# Patient Record
Sex: Male | Born: 1965
Health system: Southern US, Community
[De-identification: ages and names within clinical notes are randomized; demographics above are authoritative.]

## PROBLEM LIST (undated history)

## (undated) DIAGNOSIS — I10 Essential (primary) hypertension: Secondary | ICD-10-CM

## (undated) DIAGNOSIS — E785 Hyperlipidemia, unspecified: Secondary | ICD-10-CM

## (undated) DIAGNOSIS — E119 Type 2 diabetes mellitus without complications: Secondary | ICD-10-CM

## (undated) DIAGNOSIS — T7840XA Allergy, unspecified, initial encounter: Secondary | ICD-10-CM

## (undated) HISTORY — PX: COLONOSCOPY: SHX174

## (undated) HISTORY — PX: NO PAST SURGERIES: SHX2092

## (undated) HISTORY — DX: Allergy, unspecified, initial encounter: T78.40XA

## (undated) HISTORY — DX: Hyperlipidemia, unspecified: E78.5

## (undated) HISTORY — DX: Type 2 diabetes mellitus without complications: E11.9

## (undated) HISTORY — DX: Essential (primary) hypertension: I10

---

## 2010-01-07 ENCOUNTER — Observation Stay (HOSPITAL_COMMUNITY): Admission: EM | Admit: 2010-01-07 | Discharge: 2010-01-07 | Payer: Self-pay | Admitting: Emergency Medicine

## 2010-08-26 LAB — GLUCOSE, CAPILLARY
Glucose-Capillary: 239 mg/dL — ABNORMAL HIGH (ref 70–99)
Glucose-Capillary: 317 mg/dL — ABNORMAL HIGH (ref 70–99)

## 2010-08-26 LAB — POCT I-STAT, CHEM 8
BUN: 12 mg/dL (ref 6–23)
Calcium, Ion: 1.19 mmol/L (ref 1.12–1.32)
Chloride: 106 mEq/L (ref 96–112)
Creatinine, Ser: 1 mg/dL (ref 0.4–1.5)
Glucose, Bld: 251 mg/dL — ABNORMAL HIGH (ref 70–99)
HCT: 42 % (ref 39.0–52.0)
Hemoglobin: 14.3 g/dL (ref 13.0–17.0)
Potassium: 3.8 mEq/L (ref 3.5–5.1)
Sodium: 140 mEq/L (ref 135–145)
TCO2: 23 mmol/L (ref 0–100)

## 2011-06-04 ENCOUNTER — Ambulatory Visit (INDEPENDENT_AMBULATORY_CARE_PROVIDER_SITE_OTHER): Payer: BC Managed Care – PPO

## 2011-06-04 DIAGNOSIS — J111 Influenza due to unidentified influenza virus with other respiratory manifestations: Secondary | ICD-10-CM

## 2011-06-04 DIAGNOSIS — J40 Bronchitis, not specified as acute or chronic: Secondary | ICD-10-CM

## 2011-06-04 DIAGNOSIS — E789 Disorder of lipoprotein metabolism, unspecified: Secondary | ICD-10-CM

## 2011-08-02 ENCOUNTER — Other Ambulatory Visit: Payer: Self-pay | Admitting: Emergency Medicine

## 2011-08-02 ENCOUNTER — Other Ambulatory Visit: Payer: Self-pay | Admitting: Physician Assistant

## 2011-08-03 ENCOUNTER — Other Ambulatory Visit: Payer: Self-pay | Admitting: Emergency Medicine

## 2011-08-03 MED ORDER — METFORMIN HCL 1000 MG PO TABS: 1000.0000 mg | ORAL_TABLET | Freq: Two times a day (BID) | ORAL | Status: DC

## 2011-09-12 ENCOUNTER — Other Ambulatory Visit: Payer: Self-pay | Admitting: Physician Assistant

## 2011-09-14 ENCOUNTER — Other Ambulatory Visit: Payer: Self-pay | Admitting: *Deleted

## 2011-10-15 ENCOUNTER — Other Ambulatory Visit: Payer: Self-pay | Admitting: Physician Assistant

## 2011-10-17 ENCOUNTER — Ambulatory Visit (INDEPENDENT_AMBULATORY_CARE_PROVIDER_SITE_OTHER): Payer: BC Managed Care – PPO | Admitting: Internal Medicine

## 2011-10-17 VITALS — BP 149/88 | HR 75 | Temp 98.0°F | Resp 16 | Ht 70.5 in | Wt 210.0 lb

## 2011-10-17 DIAGNOSIS — R3589 Other polyuria: Secondary | ICD-10-CM

## 2011-10-17 DIAGNOSIS — E785 Hyperlipidemia, unspecified: Secondary | ICD-10-CM

## 2011-10-17 DIAGNOSIS — R358 Other polyuria: Secondary | ICD-10-CM

## 2011-10-17 DIAGNOSIS — E119 Type 2 diabetes mellitus without complications: Secondary | ICD-10-CM

## 2011-10-17 DIAGNOSIS — I10 Essential (primary) hypertension: Secondary | ICD-10-CM

## 2011-10-17 LAB — POCT URINALYSIS DIPSTICK
Bilirubin, UA: NEGATIVE
Glucose, UA: NEGATIVE
Ketones, UA: NEGATIVE
Leukocytes, UA: NEGATIVE
Nitrite, UA: NEGATIVE
Protein, UA: NEGATIVE
Spec Grav, UA: 1.01
Urobilinogen, UA: 0.2
pH, UA: 6.5

## 2011-10-17 LAB — POCT GLYCOSYLATED HEMOGLOBIN (HGB A1C): Hemoglobin A1C: 7.4

## 2011-10-17 LAB — POCT UA - MICROSCOPIC ONLY
Bacteria, U Microscopic: NEGATIVE
Casts, Ur, LPF, POC: NEGATIVE
Crystals, Ur, HPF, POC: NEGATIVE
Mucus, UA: NEGATIVE
RBC, urine, microscopic: NEGATIVE
WBC, Ur, HPF, POC: NEGATIVE
Yeast, UA: NEGATIVE

## 2011-10-17 LAB — GLUCOSE, POCT (MANUAL RESULT ENTRY): POC Glucose: 129

## 2011-10-17 MED ORDER — GLUCOSE BLOOD VI STRP: ORAL_STRIP | Status: DC

## 2011-10-17 MED ORDER — GLIPIZIDE ER 10 MG PO TB24: 10.0000 mg | ORAL_TABLET | Freq: Every day | ORAL | Status: DC

## 2011-10-17 MED ORDER — FREESTYLE LANCETS MISC: Status: DC

## 2011-10-17 MED ORDER — FREESTYLE SYSTEM KIT: 1.0000 | PACK | Status: DC | PRN

## 2011-10-17 MED ORDER — PRAVASTATIN SODIUM 20 MG PO TABS: 20.0000 mg | ORAL_TABLET | Freq: Every day | ORAL | Status: DC

## 2011-10-17 MED ORDER — LOSARTAN POTASSIUM 25 MG PO TABS: 25.0000 mg | ORAL_TABLET | Freq: Every day | ORAL | Status: DC

## 2011-10-17 NOTE — Patient Instructions (Signed)
Test your sugar in the am before eating and at dinner timed before eating and bring the record to your next visit in one week to re evaluate you diabetes medications.

## 2011-10-17 NOTE — Progress Notes (Signed)
Subjective:    Patient ID: Jackson Mahoney, male    DOB: 1965/11/24, 46 y.o.   MRN: [id number]  HPI Weakness thirsty urinates 2 times a night but thinks his weakness is because his sugar is low so he is not taking his diabetes medication. Also takes his bp med most of the time. No weight loss, no numbness of extremities. No visual deficits, no problems with urination. He feels better when he eats more because he feels like his sugar is running low. He lost his glucometer but sometimes gets his sugar tested at work and today after eating a sugar donut it was 96.   Review of Systems  Unable to perform ROS Constitutional: Positive for appetite change and fatigue.  HENT: Negative.   Eyes: Negative.   Respiratory: Negative.   Cardiovascular: Negative.   Gastrointestinal: Negative.   Genitourinary: Negative.   Musculoskeletal: Negative.   Skin: Negative.   Neurological: Negative.   Hematological: Negative.   Psychiatric/Behavioral: Negative.   All other systems reviewed and are negative.       Objective:   Physical Exam  Nursing note and vitals reviewed. Constitutional: He is oriented to person, place, and time. He appears well-developed and well-nourished.  HENT:  Head: Normocephalic and atraumatic.  Right Ear: External ear normal.  Left Ear: External ear normal.  Eyes: Conjunctivae and EOM are normal. Pupils are equal, round, and reactive to light.  Neck: Normal range of motion. Neck supple.  Cardiovascular: Normal rate and regular rhythm.   Pulmonary/Chest: Effort normal and breath sounds normal.  Abdominal: Soft. Bowel sounds are normal.  Musculoskeletal: Normal range of motion.  Neurological: He is alert and oriented to person, place, and time. He has normal reflexes.  Skin: Skin is warm and dry.  Psychiatric: He has a normal mood and affect. His behavior is normal. Judgment and thought content normal.    Results for orders placed in visit on 10/17/11  POCT UA - MICROSCOPIC  ONLY      Component Value Range   WBC, Ur, HPF, POC negative     RBC, urine, microscopic negative     Bacteria, U Microscopic negative     Mucus, UA negative     Epithelial cells, urine per micros 0-1     Crystals, Ur, HPF, POC negative     Casts, Ur, LPF, POC negative     Yeast, UA negative    POCT URINALYSIS DIPSTICK      Component Value Range   Color, UA yellow     Clarity, UA clear     Glucose, UA negative     Bilirubin, UA negative     Ketones, UA negative     Spec Grav, UA 1.010     Blood, UA trace-intact     pH, UA 6.5     Protein, UA negative     Urobilinogen, UA 0.2     Nitrite, UA negative     Leukocytes, UA Negative    GLUCOSE, POCT (MANUAL RESULT ENTRY)      Component Value Range   POC Glucose 129    POCT GLYCOSYLATED HEMOGLOBIN (HGB A1C)      Component Value Range   Hemoglobin A1C 7.4          Assessment & Plan:  Noncompliant with medications for diabetes and hypertension. I suspect his hunger is secondary to his uncontrolled diabetes. Will check hgb a1c and lipids. Microalbuminuria to be checked., Bp is poorly controlled bu pt is not taking meds  daily. Spent 20 minutes in discussion with the patient about the need for him to comply with medication.will continue to encourage pt to take his medication as prescribed.

## 2011-10-18 LAB — COMPREHENSIVE METABOLIC PANEL
ALT: 9 U/L (ref 0–53)
AST: 19 U/L (ref 0–37)
Albumin: 4.2 g/dL (ref 3.5–5.2)
Alkaline Phosphatase: 52 U/L (ref 39–117)
BUN: 15 mg/dL (ref 6–23)
CO2: 28 mEq/L (ref 19–32)
Calcium: 10.3 mg/dL (ref 8.4–10.5)
Chloride: 104 mEq/L (ref 96–112)
Creat: 0.94 mg/dL (ref 0.50–1.35)
Glucose, Bld: 130 mg/dL — ABNORMAL HIGH (ref 70–99)
Potassium: 4.5 mEq/L (ref 3.5–5.3)
Sodium: 136 mEq/L (ref 135–145)
Total Bilirubin: 0.3 mg/dL (ref 0.3–1.2)
Total Protein: 7.2 g/dL (ref 6.0–8.3)

## 2011-10-18 LAB — LIPID PANEL
Cholesterol: 196 mg/dL (ref 0–200)
HDL: 52 mg/dL (ref >39)
LDL Cholesterol: 129 mg/dL — ABNORMAL HIGH (ref 0–99)
Total CHOL/HDL Ratio: 3.8 Ratio
Triglycerides: 75 mg/dL (ref <150)
VLDL: 15 mg/dL (ref 0–40)

## 2011-10-18 LAB — PSA: PSA: 0.27 ng/mL (ref <=4.00)

## 2011-10-19 LAB — MICROALBUMIN, URINE: Microalb, Ur: 3.11 mg/dL — ABNORMAL HIGH (ref 0.00–1.89)

## 2011-10-28 ENCOUNTER — Other Ambulatory Visit: Payer: Self-pay | Admitting: Physician Assistant

## 2012-05-09 ENCOUNTER — Other Ambulatory Visit: Payer: Self-pay | Admitting: Physician Assistant

## 2012-07-07 ENCOUNTER — Telehealth: Payer: Self-pay

## 2012-07-07 DIAGNOSIS — I10 Essential (primary) hypertension: Secondary | ICD-10-CM

## 2012-07-07 DIAGNOSIS — E785 Hyperlipidemia, unspecified: Secondary | ICD-10-CM

## 2012-07-07 MED ORDER — LOSARTAN POTASSIUM 25 MG PO TABS: 25.0000 mg | ORAL_TABLET | Freq: Every day | ORAL | Status: DC

## 2012-07-07 MED ORDER — METFORMIN HCL 1000 MG PO TABS: 1000.0000 mg | ORAL_TABLET | Freq: Two times a day (BID) | ORAL | Status: DC

## 2012-07-07 MED ORDER — PRAVASTATIN SODIUM 20 MG PO TABS: 20.0000 mg | ORAL_TABLET | Freq: Every day | ORAL | Status: DC

## 2012-07-07 MED ORDER — GLIPIZIDE ER 10 MG PO TB24: 10.0000 mg | ORAL_TABLET | Freq: Every day | ORAL | Status: DC

## 2012-07-07 NOTE — Telephone Encounter (Signed)
I have sent in #15 of all medications, but he MUST have an office visit for any additional refills

## 2012-07-07 NOTE — Telephone Encounter (Signed)
PT SAID HE HAS MIXED UP HIS MEDICATIONS AND NEEDS A LIST OF WHAT HE IS SUPPOSED TO BE TAKING.  PLEASE CALL HIM AT [phone]

## 2012-07-07 NOTE — Telephone Encounter (Signed)
Called patient to help him. He states he is confused about what he should be taking because he has ran out. He can come in on Monday next week, ? Okay to send in a supply for him to last until he can get in, pended 15 day supply. Please advise

## 2012-07-07 NOTE — Telephone Encounter (Signed)
Patient advised, and agrees.

## 2012-07-23 ENCOUNTER — Telehealth: Payer: Self-pay

## 2012-07-23 NOTE — Telephone Encounter (Signed)
ERROR

## 2012-07-25 ENCOUNTER — Ambulatory Visit (INDEPENDENT_AMBULATORY_CARE_PROVIDER_SITE_OTHER): Payer: BC Managed Care – PPO | Admitting: Family Medicine

## 2012-07-25 VITALS — BP 158/84 | HR 76 | Temp 98.0°F | Resp 16 | Ht 70.75 in | Wt 213.6 lb

## 2012-07-25 DIAGNOSIS — E785 Hyperlipidemia, unspecified: Secondary | ICD-10-CM

## 2012-07-25 DIAGNOSIS — IMO0001 Reserved for inherently not codable concepts without codable children: Secondary | ICD-10-CM

## 2012-07-25 DIAGNOSIS — E119 Type 2 diabetes mellitus without complications: Secondary | ICD-10-CM | POA: Insufficient documentation

## 2012-07-25 DIAGNOSIS — I10 Essential (primary) hypertension: Secondary | ICD-10-CM

## 2012-07-25 DIAGNOSIS — E782 Mixed hyperlipidemia: Secondary | ICD-10-CM | POA: Insufficient documentation

## 2012-07-25 LAB — COMPREHENSIVE METABOLIC PANEL
ALT: 15 U/L (ref 0–53)
AST: 20 U/L (ref 0–37)
Albumin: 4.4 g/dL (ref 3.5–5.2)
Alkaline Phosphatase: 58 U/L (ref 39–117)
BUN: 13 mg/dL (ref 6–23)
CO2: 28 mEq/L (ref 19–32)
Calcium: 9.7 mg/dL (ref 8.4–10.5)
Chloride: 99 mEq/L (ref 96–112)
Creat: 1.06 mg/dL (ref 0.50–1.35)
Glucose, Bld: 279 mg/dL — ABNORMAL HIGH (ref 70–99)
Potassium: 4.4 mEq/L (ref 3.5–5.3)
Sodium: 133 mEq/L — ABNORMAL LOW (ref 135–145)
Total Bilirubin: 0.5 mg/dL (ref 0.3–1.2)
Total Protein: 7.6 g/dL (ref 6.0–8.3)

## 2012-07-25 LAB — GLUCOSE, POCT (MANUAL RESULT ENTRY): POC Glucose: 279 mg/dl — AB (ref 70–99)

## 2012-07-25 LAB — LIPID PANEL
Cholesterol: 184 mg/dL (ref 0–200)
HDL: 49 mg/dL (ref >39)
LDL Cholesterol: 121 mg/dL — ABNORMAL HIGH (ref 0–99)
Total CHOL/HDL Ratio: 3.8 Ratio
Triglycerides: 72 mg/dL (ref <150)
VLDL: 14 mg/dL (ref 0–40)

## 2012-07-25 LAB — POCT GLYCOSYLATED HEMOGLOBIN (HGB A1C): Hemoglobin A1C: 10.6

## 2012-07-25 MED ORDER — PRAVASTATIN SODIUM 20 MG PO TABS: 20.0000 mg | ORAL_TABLET | Freq: Every day | ORAL | Status: DC

## 2012-07-25 MED ORDER — GLIPIZIDE ER 10 MG PO TB24: 10.0000 mg | ORAL_TABLET | Freq: Every day | ORAL | Status: DC

## 2012-07-25 MED ORDER — LOSARTAN POTASSIUM 25 MG PO TABS: 25.0000 mg | ORAL_TABLET | Freq: Every day | ORAL | Status: DC

## 2012-07-25 MED ORDER — GLUCOSE BLOOD VI STRP: ORAL_STRIP | Status: AC

## 2012-07-25 MED ORDER — METFORMIN HCL 1000 MG PO TABS: 1000.0000 mg | ORAL_TABLET | Freq: Two times a day (BID) | ORAL | Status: DC

## 2012-07-25 MED ORDER — FREESTYLE LANCETS MISC: Status: AC

## 2012-07-25 MED ORDER — FREESTYLE SYSTEM KIT: 1.0000 | PACK | Status: AC | PRN

## 2012-07-25 NOTE — Progress Notes (Signed)
Subjective: 47 year old gentleman who is here to give his medicines refilled. He really does not want to get a complete physical examination in the note is listed as such. he ran out of his medicines a week ago. He does not really watch his diet, though he has lost a few pounds after getting up to a maximum of 220. He is not getting regular exercise. He has no major acute complaints. Cardiovascular unremarkable. Respiratory unremarkable. GI unremarkable except for some constipation. GU unremarkable. Nocturia x2.  Objective: Overweight male in no major distress. His eyes PERRLA fundi benign. He says that he got his last eye check a year ago or so. Throat clear. Neck supple without nodes thyromegaly. Chest clear to auscultation. Heart regular without murmurs. And soft without mass or tenderness. No ankle edema. Filament test negative.  Assessment: Hypertension Hyperlipidemia Type 2 diabetes mellitus Obesity Constipation  Plan: Check labs  Results for orders placed in visit on 07/25/12  GLUCOSE, POCT (MANUAL RESULT ENTRY)      Result Value Range   POC Glucose 279 (*) 70 - 99 mg/dl  POCT GLYCOSYLATED HEMOGLOBIN (HGB A1C)      Result Value Range   Hemoglobin A1C 10.6

## 2012-07-25 NOTE — Patient Instructions (Signed)
Take the medications faithfully  Get regular exercise  Watch your diet. You should try hard to lose about 15 pounds before your next visit.  Return in late May or early June for a complete physical examination. Try to come in in the morning before you have had anything to eat. If you ask for Dr. Alwyn Ren I will be happy to see you. Call ahead of time to make sure that I will be having an early shift at that time.

## 2012-07-26 LAB — MICROALBUMIN, URINE: Microalb, Ur: 22.62 mg/dL — ABNORMAL HIGH (ref 0.00–1.89)

## 2012-07-31 ENCOUNTER — Encounter: Payer: Self-pay | Admitting: Family Medicine

## 2012-08-29 ENCOUNTER — Other Ambulatory Visit: Payer: Self-pay | Admitting: Family Medicine

## 2012-08-30 ENCOUNTER — Ambulatory Visit: Payer: BC Managed Care – PPO

## 2012-08-30 ENCOUNTER — Ambulatory Visit (INDEPENDENT_AMBULATORY_CARE_PROVIDER_SITE_OTHER): Payer: BC Managed Care – PPO | Admitting: Family Medicine

## 2012-08-30 VITALS — BP 161/95 | HR 82 | Temp 98.0°F | Resp 16 | Ht 70.5 in | Wt 216.4 lb

## 2012-08-30 DIAGNOSIS — R109 Unspecified abdominal pain: Secondary | ICD-10-CM

## 2012-08-30 DIAGNOSIS — K5900 Constipation, unspecified: Secondary | ICD-10-CM

## 2012-08-30 DIAGNOSIS — E119 Type 2 diabetes mellitus without complications: Secondary | ICD-10-CM

## 2012-08-30 LAB — POCT CBC
Granulocyte percent: 55.6 %G (ref 37–80)
HCT, POC: 38.3 % — AB (ref 43.5–53.7)
Hemoglobin: 11.9 g/dL — AB (ref 14.1–18.1)
Lymph, poc: 1.9 (ref 0.6–3.4)
MCH, POC: 26.9 pg — AB (ref 27–31.2)
MCHC: 31.1 g/dL — AB (ref 31.8–35.4)
MCV: 86.7 fL (ref 80–97)
MID (cbc): 0.3 (ref 0–0.9)
MPV: 9.9 fL (ref 0–99.8)
POC Granulocyte: 2.8 (ref 2–6.9)
POC LYMPH PERCENT: 38.2 %L (ref 10–50)
POC MID %: 6.2 %M (ref 0–12)
Platelet Count, POC: 234 10*3/uL (ref 142–424)
RBC: 4.42 M/uL — AB (ref 4.69–6.13)
RDW, POC: 14.7 %
WBC: 5 10*3/uL (ref 4.6–10.2)

## 2012-08-30 LAB — GLUCOSE, POCT (MANUAL RESULT ENTRY): POC Glucose: 148 mg/dl — AB (ref 70–99)

## 2012-08-30 NOTE — Patient Instructions (Signed)
Drink lots of fluids  Continue to walk regularly.  Eat plenty of fruits, avoiding excessive bananas and plantains  Take MiraLax twice daily for a couple of days, then once daily.  If Your bowel start getting loose, cut back to one half dose daily, then used only 2-3 times a week as needed.  Watch your bowels are moving regularly you may wish to consider taking Metamucil daily.  If the MiraLax has not helped the constipation, you can take a couple of Dulcolax tablets in addition to that. Use this only on occasion when her when necessary.

## 2012-08-30 NOTE — Progress Notes (Signed)
Subjective: 47 year old man from [place] who was here last month. He says his diabetes is doing much better. For the last 2 weeks she's been having intermittent abdominal pain, more in the mornings. His abdomen does feel a little bloated. He has some problems with an urge to move his bowels and then them not moving at times. He's not been having diarrhea. He has no nausea or vomiting. He does have a lot of gas. No fevers. No blood in his stool or urine. No dysuria.  Objective: Pleasant alert gentleman in no acute distress. Abdomen has normal bowel sounds. Mildly tympanitic. Soft, without organomegaly or masses but is full. No CVA tenderness.  Assessment: Abdominal pain 4 constipation Diabetes mellitus  Plan: CBC glucose and x-ray of abdomen.  Results for orders placed in visit on 08/30/12  POCT CBC      Result Value Range   WBC 5.0  4.6 - 10.2 K/uL   Lymph, poc 1.9  0.6 - 3.4   POC LYMPH PERCENT 38.2  10 - 50 %L   MID (cbc) 0.3  0 - 0.9   POC MID % 6.2  0 - 12 %M   POC Granulocyte 2.8  2 - 6.9   Granulocyte percent 55.6  37 - 80 %G   RBC 4.42 (*) 4.69 - 6.13 M/uL   Hemoglobin 11.9 (*) 14.1 - 18.1 g/dL   HCT, POC 78.2 (*) 95.6 - 53.7 %   MCV 86.7  80 - 97 fL   MCH, POC 26.9 (*) 27 - 31.2 pg   MCHC 31.1 (*) 31.8 - 35.4 g/dL   RDW, POC 21.3     Platelet Count, POC 234  142 - 424 K/uL   MPV 9.9  0 - 99.8 fL  GLUCOSE, POCT (MANUAL RESULT ENTRY)      Result Value Range   POC Glucose 148 (*) 70 - 99 mg/dl   [facility] reading (PRIMARY) by  Dr. Alwyn Ren Normal except lots of stool.  Constipation Abdominal pain

## 2012-10-16 ENCOUNTER — Ambulatory Visit (INDEPENDENT_AMBULATORY_CARE_PROVIDER_SITE_OTHER): Payer: BC Managed Care – PPO | Admitting: Family Medicine

## 2012-10-16 VITALS — BP 163/93 | HR 87 | Temp 98.0°F | Resp 16 | Ht 71.0 in | Wt 216.0 lb

## 2012-10-16 DIAGNOSIS — R112 Nausea with vomiting, unspecified: Secondary | ICD-10-CM

## 2012-10-16 DIAGNOSIS — H8110 Benign paroxysmal vertigo, unspecified ear: Secondary | ICD-10-CM

## 2012-10-16 DIAGNOSIS — I10 Essential (primary) hypertension: Secondary | ICD-10-CM

## 2012-10-16 DIAGNOSIS — E119 Type 2 diabetes mellitus without complications: Secondary | ICD-10-CM

## 2012-10-16 DIAGNOSIS — E785 Hyperlipidemia, unspecified: Secondary | ICD-10-CM

## 2012-10-16 DIAGNOSIS — R42 Dizziness and giddiness: Secondary | ICD-10-CM

## 2012-10-16 LAB — CBC WITH DIFFERENTIAL/PLATELET
Basophils Absolute: 0 10*3/uL (ref 0.0–0.1)
Basophils Relative: 1 % (ref 0–1)
Eosinophils Absolute: 0.1 10*3/uL (ref 0.0–0.7)
Eosinophils Relative: 2 % (ref 0–5)
HCT: 37.9 % — ABNORMAL LOW (ref 39.0–52.0)
Hemoglobin: 12.6 g/dL — ABNORMAL LOW (ref 13.0–17.0)
Lymphocytes Relative: 27 % (ref 12–46)
Lymphs Abs: 1.7 10*3/uL (ref 0.7–4.0)
MCH: 28.1 pg (ref 26.0–34.0)
MCHC: 33.2 g/dL (ref 30.0–36.0)
MCV: 84.4 fL (ref 78.0–100.0)
Monocytes Absolute: 0.4 10*3/uL (ref 0.1–1.0)
Monocytes Relative: 6 % (ref 3–12)
Neutro Abs: 4 10*3/uL (ref 1.7–7.7)
Neutrophils Relative %: 64 % (ref 43–77)
Platelets: 200 10*3/uL (ref 150–400)
RBC: 4.49 MIL/uL (ref 4.22–5.81)
RDW: 14.8 % (ref 11.5–15.5)
WBC: 6.2 10*3/uL (ref 4.0–10.5)

## 2012-10-16 LAB — COMPREHENSIVE METABOLIC PANEL
ALT: 9 U/L (ref 0–53)
AST: 14 U/L (ref 0–37)
Albumin: 4.2 g/dL (ref 3.5–5.2)
Alkaline Phosphatase: 40 U/L (ref 39–117)
BUN: 12 mg/dL (ref 6–23)
CO2: 27 mEq/L (ref 19–32)
Calcium: 9.9 mg/dL (ref 8.4–10.5)
Chloride: 103 mEq/L (ref 96–112)
Creat: 1.01 mg/dL (ref 0.50–1.35)
Glucose, Bld: 126 mg/dL — ABNORMAL HIGH (ref 70–99)
Potassium: 5.1 mEq/L (ref 3.5–5.3)
Sodium: 135 mEq/L (ref 135–145)
Total Bilirubin: 0.3 mg/dL (ref 0.3–1.2)
Total Protein: 7.2 g/dL (ref 6.0–8.3)

## 2012-10-16 LAB — GLUCOSE, POCT (MANUAL RESULT ENTRY): POC Glucose: 144 mg/dl — AB (ref 70–99)

## 2012-10-16 LAB — POCT GLYCOSYLATED HEMOGLOBIN (HGB A1C): Hemoglobin A1C: 8.1

## 2012-10-16 MED ORDER — MECLIZINE HCL 25 MG PO TABS: 25.0000 mg | ORAL_TABLET | Freq: Three times a day (TID) | ORAL | Status: DC | PRN

## 2012-10-16 MED ORDER — PROMETHAZINE HCL 25 MG/ML IJ SOLN: 25.0000 mg | Freq: Four times a day (QID) | INTRAMUSCULAR | Status: DC | PRN

## 2012-10-16 MED ORDER — PROMETHAZINE HCL 25 MG PO TABS: 25.0000 mg | ORAL_TABLET | Freq: Three times a day (TID) | ORAL | Status: DC | PRN

## 2012-10-16 MED ORDER — PROMETHAZINE HCL 25 MG/ML IJ SOLN
25.0000 mg | Freq: Once | INTRAMUSCULAR | Status: AC
  Administered 2012-10-16: 25 mg via INTRAMUSCULAR

## 2012-10-16 MED ORDER — LOSARTAN POTASSIUM-HCTZ 50-12.5 MG PO TABS: 1.0000 | ORAL_TABLET | Freq: Every day | ORAL | Status: DC

## 2012-10-16 MED ORDER — LOSARTAN POTASSIUM-HCTZ 100-25 MG PO TABS: 1.0000 | ORAL_TABLET | Freq: Every day | ORAL | Status: DC

## 2012-10-16 NOTE — Progress Notes (Signed)
  Subjective:    Patient ID: Jackson Mahoney, male    DOB: 05-15-1966, 47 y.o.   MRN: [id number]  HPI    Review of Systems     Objective:   Physical Exam        Results for orders placed in visit on 10/16/12  GLUCOSE, POCT (MANUAL RESULT ENTRY)      Result Value Range   POC Glucose 144 (*) 70 - 99 mg/dl  POCT GLYCOSYLATED HEMOGLOBIN (HGB A1C)      Result Value Range   Hemoglobin A1C 8.1      Assessment & Plan:

## 2012-10-16 NOTE — Patient Instructions (Addendum)
Benign Positional Vertigo  Vertigo means you feel like you or your surroundings are moving when they are not. Benign positional vertigo is the most common form of vertigo. Benign means that the cause of your condition is not serious. Benign positional vertigo is more common in older adults.  CAUSES   Benign positional vertigo is the result of an upset in the labyrinth system. This is an area in the middle ear that helps control your balance. This may be caused by a viral infection, head injury, or repetitive motion. However, often no specific cause is found.  SYMPTOMS   Symptoms of benign positional vertigo occur when you move your head or eyes in different directions. Some of the symptoms may include:  · Loss of balance and falls.  · Vomiting.  · Blurred vision.  · Dizziness.  · Nausea.  · Involuntary eye movements (nystagmus).  DIAGNOSIS   Benign positional vertigo is usually diagnosed by physical exam. If the specific cause of your benign positional vertigo is unknown, your caregiver may perform imaging tests, such as magnetic resonance imaging (MRI) or computed tomography (CT).  TREATMENT   Your caregiver may recommend movements or procedures to correct the benign positional vertigo. Medicines such as meclizine, benzodiazepines, and medicines for nausea may be used to treat your symptoms. In rare cases, if your symptoms are caused by certain conditions that affect the inner ear, you may need surgery.  HOME CARE INSTRUCTIONS   · Follow your caregiver's instructions.  · Move slowly. Do not make sudden body or head movements.  · Avoid driving.  · Avoid operating heavy machinery.  · Avoid performing any tasks that would be dangerous to you or others during a vertigo episode.  · Drink enough fluids to keep your urine clear or pale yellow.  SEEK IMMEDIATE MEDICAL CARE IF:   · You develop problems with walking, weakness, numbness, or using your arms, hands, or legs.  · You have difficulty speaking.  · You develop  severe headaches.  · Your nausea or vomiting continues or gets worse.  · You develop visual changes.  · Your family or friends notice any behavioral changes.  · Your condition gets worse.  · You have a fever.  · You develop a stiff neck or sensitivity to light.  MAKE SURE YOU:   · Understand these instructions.  · Will watch your condition.  · Will get help right away if you are not doing well or get worse.  Document Released: 03/05/2006 Document Revised: 08/20/2011 Document Reviewed: 02/15/2011  ExitCare® Patient Information ©2013 [employer].

## 2012-11-27 ENCOUNTER — Telehealth: Payer: Self-pay

## 2012-11-27 NOTE — Telephone Encounter (Signed)
Patient wants to know what the dosage amount is on his diabetic medicine. [phone]

## 2012-11-27 NOTE — Telephone Encounter (Signed)
Glucotrol one daily metformin two daily, concerned he does not know this. Left message for him to call me back.

## 2012-12-02 NOTE — Telephone Encounter (Signed)
Left another message, if he has questions, he should call me back.

## 2013-08-12 ENCOUNTER — Other Ambulatory Visit: Payer: Self-pay | Admitting: Family Medicine

## 2013-08-17 ENCOUNTER — Telehealth: Payer: Self-pay

## 2013-08-17 DIAGNOSIS — E785 Hyperlipidemia, unspecified: Secondary | ICD-10-CM

## 2013-08-17 DIAGNOSIS — E119 Type 2 diabetes mellitus without complications: Secondary | ICD-10-CM

## 2013-08-17 NOTE — Telephone Encounter (Signed)
Pt states that he is currently out of the following medications: -Losartan  glipizide -Metformin pravastatin  Pt has already been told to come in but he unable to do so until about two weeks, so he would just like enough to cover him until then. Best# [phone] or [phone]

## 2013-08-18 ENCOUNTER — Other Ambulatory Visit: Payer: Self-pay | Admitting: Family Medicine

## 2013-08-18 MED ORDER — LOSARTAN POTASSIUM-HCTZ 50-12.5 MG PO TABS: 1.0000 | ORAL_TABLET | Freq: Every day | ORAL | Status: DC

## 2013-08-18 MED ORDER — METFORMIN HCL 1000 MG PO TABS: 1000.0000 mg | ORAL_TABLET | Freq: Two times a day (BID) | ORAL | Status: DC

## 2013-08-18 MED ORDER — PRAVASTATIN SODIUM 20 MG PO TABS: 20.0000 mg | ORAL_TABLET | Freq: Every day | ORAL | Status: DC

## 2013-08-18 MED ORDER — GLIPIZIDE ER 10 MG PO TB24: ORAL_TABLET | ORAL | Status: DC

## 2013-08-18 NOTE — Telephone Encounter (Signed)
Sent in 30 day supply- he needs to come in within 30 days. LM for Rtn call

## 2013-08-18 NOTE — Telephone Encounter (Signed)
Pt.notified

## 2013-09-04 ENCOUNTER — Ambulatory Visit (INDEPENDENT_AMBULATORY_CARE_PROVIDER_SITE_OTHER): Payer: BC Managed Care – PPO | Admitting: Family Medicine

## 2013-09-04 VITALS — BP 158/84 | HR 87 | Temp 98.1°F | Resp 16 | Ht 70.5 in | Wt 213.0 lb

## 2013-09-04 DIAGNOSIS — I10 Essential (primary) hypertension: Secondary | ICD-10-CM

## 2013-09-04 DIAGNOSIS — R079 Chest pain, unspecified: Secondary | ICD-10-CM

## 2013-09-04 DIAGNOSIS — E785 Hyperlipidemia, unspecified: Secondary | ICD-10-CM

## 2013-09-04 DIAGNOSIS — E119 Type 2 diabetes mellitus without complications: Secondary | ICD-10-CM

## 2013-09-04 LAB — CBC
HCT: 36.8 % — ABNORMAL LOW (ref 39.0–52.0)
Hemoglobin: 12.4 g/dL — ABNORMAL LOW (ref 13.0–17.0)
MCH: 27.7 pg (ref 26.0–34.0)
MCHC: 33.7 g/dL (ref 30.0–36.0)
MCV: 82.1 fL (ref 78.0–100.0)
Platelets: 243 10*3/uL (ref 150–400)
RBC: 4.48 MIL/uL (ref 4.22–5.81)
RDW: 14.8 % (ref 11.5–15.5)
WBC: 5.2 10*3/uL (ref 4.0–10.5)

## 2013-09-04 LAB — COMPREHENSIVE METABOLIC PANEL
ALT: <8 U/L (ref 0–53)
AST: 17 U/L (ref 0–37)
Albumin: 4.3 g/dL (ref 3.5–5.2)
Alkaline Phosphatase: 44 U/L (ref 39–117)
BUN: 13 mg/dL (ref 6–23)
CO2: 28 mEq/L (ref 19–32)
Calcium: 9.9 mg/dL (ref 8.4–10.5)
Chloride: 100 mEq/L (ref 96–112)
Creat: 1.15 mg/dL (ref 0.50–1.35)
Glucose, Bld: 198 mg/dL — ABNORMAL HIGH (ref 70–99)
Potassium: 4.7 mEq/L (ref 3.5–5.3)
Sodium: 135 mEq/L (ref 135–145)
Total Bilirubin: 0.3 mg/dL (ref 0.2–1.2)
Total Protein: 7.2 g/dL (ref 6.0–8.3)

## 2013-09-04 LAB — POCT UA - MICROSCOPIC ONLY
Bacteria, U Microscopic: NEGATIVE
Casts, Ur, LPF, POC: NEGATIVE
Crystals, Ur, HPF, POC: NEGATIVE
Epithelial cells, urine per micros: NEGATIVE
Mucus, UA: NEGATIVE
RBC, urine, microscopic: NEGATIVE
WBC, Ur, HPF, POC: NEGATIVE
Yeast, UA: NEGATIVE

## 2013-09-04 LAB — POCT URINALYSIS DIPSTICK
Bilirubin, UA: NEGATIVE
Glucose, UA: NEGATIVE
Ketones, UA: NEGATIVE
Leukocytes, UA: NEGATIVE
Nitrite, UA: NEGATIVE
Protein, UA: 30
Spec Grav, UA: <=1.005
Urobilinogen, UA: 0.2
pH, UA: 6.5

## 2013-09-04 LAB — LIPID PANEL
Cholesterol: 163 mg/dL (ref 0–200)
HDL: 55 mg/dL (ref >39)
LDL Cholesterol: 94 mg/dL (ref 0–99)
Total CHOL/HDL Ratio: 3 Ratio
Triglycerides: 71 mg/dL (ref <150)
VLDL: 14 mg/dL (ref 0–40)

## 2013-09-04 LAB — POCT GLYCOSYLATED HEMOGLOBIN (HGB A1C): Hemoglobin A1C: 8.8

## 2013-09-04 MED ORDER — GLIPIZIDE ER 10 MG PO TB24: ORAL_TABLET | ORAL | Status: DC

## 2013-09-04 MED ORDER — LOSARTAN POTASSIUM-HCTZ 50-12.5 MG PO TABS: 1.0000 | ORAL_TABLET | Freq: Every day | ORAL | Status: DC

## 2013-09-04 MED ORDER — PRAVASTATIN SODIUM 20 MG PO TABS: 20.0000 mg | ORAL_TABLET | Freq: Every day | ORAL | Status: DC

## 2013-09-04 MED ORDER — METFORMIN HCL 1000 MG PO TABS: 1000.0000 mg | ORAL_TABLET | Freq: Two times a day (BID) | ORAL | Status: DC

## 2013-09-04 MED ORDER — METOPROLOL SUCCINATE ER 50 MG PO TB24: 50.0000 mg | ORAL_TABLET | Freq: Every day | ORAL | Status: DC

## 2013-09-04 NOTE — Progress Notes (Signed)
Subjective:    Patient ID: Jackson Mahoney, male    DOB: June 22, 1965, 48 y.o.   MRN: [id number]  HPI  Scribed for Elvina SidleKurt Lauenstein MD, the patient was seen in room 4. This chart was scribed by Lewanda RifeAlexandra Hurtado, ED scribe. Patient's care was started at 4:39 PM  HPI Comments: Hx was provided by the pt.  Jackson Mahoney is a 48 y.o. male who presents to the [facility] complaining of constant diffuse headaches onset 2 weeks. Describes headaches as moderate in severity. Reports associated mild dizziness. Reports waxing and waning central chest pain (for 2 weeks). Reports trying tylenol with no relief of symptoms. Denies any aggravating factors. Denies associated leg swelling, and shortness of breath. Reports taking Hyzaar as prescribed. Denies smoking cigarettes. Reports he is an occasional drinker.   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  Reports PMHx HTN, HLD, and DM type 2.    Past Medical History  Diagnosis Date   Diabetes mellitus without complication    Hyperlipidemia    Hypertension     History reviewed. No pertinent past surgical history.  Family History  Problem Relation Age of Onset   Diabetes Father     History   Social History   Marital Status: Single    Spouse Name: N/A    Number of Children: N/A   Years of Education: N/A   Occupational History   Not on file.   Social History Main Topics   Smoking status: Never Smoker    Smokeless tobacco: Not on file   Alcohol Use: 1.2 oz/week    2 Cans of beer per week   Drug Use: No   Sexual Activity: No   Other Topics Concern   Not on file   Social History Narrative   No narrative on file    Allergies  Allergen Reactions   Wasp Venom Anaphylaxis   Chloroquine Itching    Patient Active Problem List   Diagnosis Date Noted   Type II or unspecified type diabetes mellitus without mention of complication, not stated as uncontrolled 07/25/2012   HTN (hypertension)  07/25/2012   Other and unspecified hyperlipidemia 07/25/2012    Filed Vitals:   09/04/13 1603  BP: 158/84  Pulse: 87  Temp: 98.1 F (36.7 C)  Resp: 16  Height: 5' 10.5" (1.791 m)  Weight: 213 lb (96.616 kg)  SpO2: 99%        Review of Systems  Respiratory: Negative for shortness of breath.   Cardiovascular: Positive for chest pain. Negative for leg swelling.  Neurological: Positive for headaches.  Psychiatric/Behavioral: Negative for confusion.       Objective:   Physical Exam  Nursing note and vitals reviewed. Constitutional: He is oriented to person, place, and time. He appears well-developed and well-nourished. No distress.  HENT:  Head: Normocephalic and atraumatic.  Eyes: EOM are normal.  Neck: Neck supple. No tracheal deviation present.  Cardiovascular: Normal rate and regular rhythm.   Pulmonary/Chest: Effort normal. No respiratory distress.  Abdominal: Soft. There is no tenderness.  Musculoskeletal: Normal range of motion.  Neurological: He is alert and oriented to person, place, and time.  Skin: Skin is warm and dry.  Psychiatric: He has a normal mood and affect. His behavior is normal.   EKG:  LVH with strain pattern Results for orders placed in visit on 09/04/13  POCT GLYCOSYLATED HEMOGLOBIN (HGB A1C)      Result Value Ref Range   Hemoglobin A1C 8.8  POCT URINALYSIS DIPSTICK      Result Value Ref Range   Color, UA light yellow     Clarity, UA clear     Glucose, UA neg     Bilirubin, UA neg     Ketones, UA neg     Spec Grav, UA <=1.005     Blood, UA tr-lysed     pH, UA 6.5     Protein, UA 30     Urobilinogen, UA 0.2     Nitrite, UA neg     Leukocytes, UA Negative    POCT UA - MICROSCOPIC ONLY      Result Value Ref Range   WBC, Ur, HPF, POC neg     RBC, urine, microscopic neg     Bacteria, U Microscopic neg     Mucus, UA neg     Epithelial cells, urine per micros neg     Crystals, Ur, HPF, POC neg     Casts, Ur, LPF, POC neg      Yeast, UA neg       4:41 PM BP 158/74       Assessment & Plan:    2 hypertension with evidence of LVH. Patient is to be followed more closely. We'll have him return in one month. Chest pain - Plan: CBC, POCT glycosylated hemoglobin (Hb A1C), POCT urinalysis dipstick, POCT UA - Microscopic Only, Comprehensive metabolic panel, Lipid panel, T4, free, EKG 12-Lead, losartan-hydrochlorothiazide (HYZAAR) 50-12.5 MG per tablet, metoprolol succinate (TOPROL-XL) 50 MG 24 hr tablet  Unspecified essential hypertension - Plan: CBC, POCT glycosylated hemoglobin (Hb A1C), POCT urinalysis dipstick, POCT UA - Microscopic Only, Comprehensive metabolic panel, Lipid panel, T4, free, losartan-hydrochlorothiazide (HYZAAR) 50-12.5 MG per tablet, metoprolol succinate (TOPROL-XL) 50 MG 24 hr tablet  Type II or unspecified type diabetes mellitus without mention of complication, not stated as uncontrolled - Plan: CBC, POCT glycosylated hemoglobin (Hb A1C), POCT urinalysis dipstick, POCT UA - Microscopic Only, Comprehensive metabolic panel, Lipid panel  Signed, Elvina Sidle, MD

## 2013-09-04 NOTE — Patient Instructions (Signed)
Please return in one month to see how the medicine is working    Hypertension As your heart beats, it forces blood through your arteries. This force is your blood pressure. If the pressure is too high, it is called hypertension (HTN) or high blood pressure. HTN is dangerous because you may have it and not know it. High blood pressure may mean that your heart has to work harder to pump blood. Your arteries may be narrow or stiff. The extra work puts you at risk for heart disease, stroke, and other problems.  Blood pressure consists of two numbers, a higher number over a lower, 110/72, for example. It is stated as "110 over 72." The ideal is below 120 for the top number (systolic) and under 80 for the bottom (diastolic). Write down your blood pressure today. You should pay close attention to your blood pressure if you have certain conditions such as:  Heart failure.  Prior heart attack.  Diabetes  Chronic kidney disease.  Prior stroke.  Multiple risk factors for heart disease. To see if you have HTN, your blood pressure should be measured while you are seated with your arm held at the level of the heart. It should be measured at least twice. A one-time elevated blood pressure reading (especially in the Emergency Department) does not mean that you need treatment. There may be conditions in which the blood pressure is different between your right and left arms. It is important to see your caregiver soon for a recheck. Most people have essential hypertension which means that there is not a specific cause. This type of high blood pressure may be lowered by changing lifestyle factors such as:  Stress.  Smoking.  Lack of exercise.  Excessive weight.  Drug/tobacco/alcohol use.  Eating less salt. Most people do not have symptoms from high blood pressure until it has caused damage to the body. Effective treatment can often prevent, delay or reduce that damage. TREATMENT  When a cause has  been identified, treatment for high blood pressure is directed at the cause. There are a large number of medications to treat HTN. These fall into several categories, and your caregiver will help you select the medicines that are best for you. Medications may have side effects. You should review side effects with your caregiver. If your blood pressure stays high after you have made lifestyle changes or started on medicines,   Your medication(s) may need to be changed.  Other problems may need to be addressed.  Be certain you understand your prescriptions, and know how and when to take your medicine.  Be sure to follow up with your caregiver within the time frame advised (usually within two weeks) to have your blood pressure rechecked and to review your medications.  If you are taking more than one medicine to lower your blood pressure, make sure you know how and at what times they should be taken. Taking two medicines at the same time can result in blood pressure that is too low. SEEK IMMEDIATE MEDICAL CARE IF:  You develop a severe headache, blurred or changing vision, or confusion.  You have unusual weakness or numbness, or a faint feeling.  You have severe chest or abdominal pain, vomiting, or breathing problems. MAKE SURE YOU:   Understand these instructions.  Will watch your condition.  Will get help right away if you are not doing well or get worse. Document Released: 05/28/2005 Document Revised: 08/20/2011 Document Reviewed: 01/16/2008 [facility] Patient Information 2014 [place], [place].

## 2013-09-05 LAB — T4, FREE: Free T4: 1.26 ng/dL (ref 0.80–1.80)

## 2013-09-11 ENCOUNTER — Telehealth: Payer: Self-pay

## 2013-09-11 NOTE — Telephone Encounter (Signed)
Patient called stated while taking medication Metoprolol 50 mg he noticed blood in his stool. He want to know should he stop taking the medication or not sure what to do.

## 2013-09-11 NOTE — Telephone Encounter (Signed)
Pt says he read the SE of the Metoprolol and he said that he thinks the blood in his stool is from this med. Please advise.

## 2013-09-11 NOTE — Telephone Encounter (Signed)
Metoprolol SE should not cause any intestinal bleeding.

## 2013-09-12 NOTE — Telephone Encounter (Signed)
Pt notified. Said the bleeding only happened once. Advised to RTC if it happens again

## 2013-09-13 NOTE — Telephone Encounter (Signed)
PT STATES THAT DR.LAUENSTEIN STATED THAT HE WOULD PRESCRIBE HIM PENICILLIN FOR HIS INFECTED TOOTH. PLEASE ADVISE. BEST# [phone]

## 2013-09-13 NOTE — Telephone Encounter (Signed)
He has not been seen for this problem. He needs to RTC. Left message on machine to call back

## 2013-09-13 NOTE — Telephone Encounter (Signed)
Dr. Elbert EwingsL, pt says that you looked at his tooth and told him that you would send in PCN but I don't see any documentation of this anywhere.  Do you remember if you talked with him about this?? Please advise. Thanks

## 2013-09-14 MED ORDER — AMOXICILLIN 875 MG PO TABS: 875.0000 mg | ORAL_TABLET | Freq: Two times a day (BID) | ORAL | Status: DC

## 2013-09-14 NOTE — Telephone Encounter (Signed)
lmom that rx was sent into pharmacy 

## 2013-10-23 ENCOUNTER — Other Ambulatory Visit: Payer: Self-pay | Admitting: Family Medicine

## 2013-11-13 ENCOUNTER — Other Ambulatory Visit: Payer: Self-pay | Admitting: Family Medicine

## 2013-11-19 ENCOUNTER — Other Ambulatory Visit: Payer: Self-pay | Admitting: Family Medicine

## 2013-12-20 ENCOUNTER — Other Ambulatory Visit: Payer: Self-pay | Admitting: Physician Assistant

## 2013-12-20 ENCOUNTER — Other Ambulatory Visit: Payer: Self-pay | Admitting: Family Medicine

## 2014-01-30 ENCOUNTER — Other Ambulatory Visit: Payer: Self-pay | Admitting: Physician Assistant

## 2014-02-22 ENCOUNTER — Ambulatory Visit (INDEPENDENT_AMBULATORY_CARE_PROVIDER_SITE_OTHER): Payer: BC Managed Care – PPO | Admitting: Family Medicine

## 2014-02-22 VITALS — BP 128/80 | HR 76 | Temp 98.0°F | Resp 16 | Ht 71.0 in | Wt 215.0 lb

## 2014-02-22 DIAGNOSIS — E119 Type 2 diabetes mellitus without complications: Secondary | ICD-10-CM

## 2014-02-22 DIAGNOSIS — E785 Hyperlipidemia, unspecified: Secondary | ICD-10-CM

## 2014-02-22 DIAGNOSIS — Z79899 Other long term (current) drug therapy: Secondary | ICD-10-CM

## 2014-02-22 DIAGNOSIS — R079 Chest pain, unspecified: Secondary | ICD-10-CM

## 2014-02-22 DIAGNOSIS — I10 Essential (primary) hypertension: Secondary | ICD-10-CM

## 2014-02-22 LAB — POCT GLYCOSYLATED HEMOGLOBIN (HGB A1C): Hemoglobin A1C: 8.2

## 2014-02-22 MED ORDER — PRAVASTATIN SODIUM 20 MG PO TABS: 20.0000 mg | ORAL_TABLET | Freq: Every day | ORAL | Status: DC

## 2014-02-22 MED ORDER — GLIPIZIDE ER 10 MG PO TB24: 10.0000 mg | ORAL_TABLET | Freq: Every day | ORAL | Status: DC

## 2014-02-22 MED ORDER — SITAGLIPTIN PHOSPHATE 100 MG PO TABS: 100.0000 mg | ORAL_TABLET | Freq: Every day | ORAL | Status: DC

## 2014-02-22 MED ORDER — METOPROLOL SUCCINATE ER 50 MG PO TB24: 50.0000 mg | ORAL_TABLET | Freq: Every day | ORAL | Status: DC

## 2014-02-22 MED ORDER — METFORMIN HCL 1000 MG PO TABS: 1000.0000 mg | ORAL_TABLET | Freq: Two times a day (BID) | ORAL | Status: DC

## 2014-02-22 MED ORDER — LOSARTAN POTASSIUM 50 MG PO TABS: 50.0000 mg | ORAL_TABLET | Freq: Every day | ORAL | Status: DC

## 2014-02-22 NOTE — Progress Notes (Signed)
Subjective:  This chart was scribed for [name], MD, by Starleen Arms, ED Scribe. This patient was seen in room Rm 8 and the patient's care was started at 7:00 PM.   Patient ID: Jackson Mahoney, male    DOB: 1965-11-21, 48 y.o.   MRN: [id number] Chief Complaint  Patient presents with  . Medication Refill    glipizide,metformin,pravastatin,losartan    HPI HPI Comments: Jackson Mahoney is a 48 y.o. male who had his last A1c 5 months ago was 8.8.  Patient's lipid panel 5 months ago was in range. He presents to to [facility] needing medication refill.  Patient denies problems with his medications but states he has run out of all his medications several days ago.  He states he has been out of Losartan for 4 days.   Patient reports he last ate at 10 am today.  Patient denies checking his blood pressure or blood sugar regularly.  Patient reports he walks 2 miles per day but says he is having difficulty following his diet.  Patient denies having or seeing an eye doctor within the past year.  Patient denies foot complications.  Patient reports he takes ASA once daily.   Past Medical History  Diagnosis Date  . Diabetes mellitus without complication   . Hyperlipidemia   . Hypertension    Current Outpatient Prescriptions on File Prior to Visit  Medication Sig Dispense Refill  . aspirin 81 MG EC tablet Take 81 mg by mouth daily. Swallow whole.      Marland Kitchen glipiZIDE (GLUCOTROL XL) 10 MG 24 hr tablet TAKE 1 TABLET BY MOUTH EVERY DAY  30 tablet  0  . glipiZIDE (GLUCOTROL XL) 10 MG 24 hr tablet Take 1 tablet (10 mg total) by mouth daily. NO MORE REFILLS WITHOUT OFFICE VISIT - 2ND NOTICE  15 tablet  0  . losartan-hydrochlorothiazide (HYZAAR) 50-12.5 MG per tablet Take 1 tablet by mouth daily. PATIENT NEEDS OFFICE VISIT FOR ADDITIONAL REFILLS  30 tablet  0  . losartan-hydrochlorothiazide (HYZAAR) 50-12.5 MG per tablet Take 1 tablet by mouth daily. NO MORE REFILLS WITHOUT OFFICE VISIT - 2ND NOTICE  15 tablet  0  . metFORMIN  (GLUCOPHAGE) 1000 MG tablet Take 1 tablet (1,000 mg total) by mouth 2 (two) times daily. NO MORE REFILLS WITHOUT OFFICE VISIT - 2ND NOTICE  30 tablet  0  . metoprolol succinate (TOPROL-XL) 50 MG 24 hr tablet Take 1 tablet (50 mg total) by mouth daily. Take with or immediately following a meal.  90 tablet  3  . pravastatin (PRAVACHOL) 20 MG tablet Take 1 tablet (20 mg total) by mouth daily.  30 tablet  0  . pravastatin (PRAVACHOL) 20 MG tablet Take 1 tablet (20 mg total) by mouth daily. NO MORE REFILLS WITHOUT OFFICE VISIT - 2ND NOTICE  15 tablet  0  . glucose monitoring kit (FREESTYLE) monitoring kit 1 each by Does not apply route as needed for other.  1 each  0   No current facility-administered medications on file prior to visit.   Allergies  Allergen Reactions  . Wasp Venom Anaphylaxis  . Chloroquine Itching    Review of Systems  Constitutional: Negative for fever, chills, activity change, appetite change and unexpected weight change.  Gastrointestinal: Negative for nausea, vomiting and diarrhea.  Genitourinary: Negative for frequency.  Neurological: Negative for dizziness.       Objective:   Physical Exam  Nursing note and vitals reviewed. Constitutional: He is oriented to person, place, and time.  He appears well-developed and well-nourished. No distress.  HENT:  Head: Normocephalic and atraumatic.  Eyes: Conjunctivae and EOM are normal.  Neck: Neck supple. No tracheal deviation present.  Cardiovascular: Normal rate and regular rhythm.   Normal S1/S2  Pulmonary/Chest: Effort normal and breath sounds normal. No respiratory distress. He has no wheezes. He has no rales.  Musculoskeletal: Normal range of motion.  Neurological: He is alert and oriented to person, place, and time.  Skin: Skin is warm and dry.  Psychiatric: He has a normal mood and affect. His behavior is normal.   BP 128/80  Pulse 76  Temp(Src) 98 F (36.7 C) (Oral)  Resp 16  Ht 5' 11"  (1.803 m)  Wt 215 lb  (97.523 kg)  BMI 30.00 kg/m2  SpO2 98%    Assessment & Plan:  7:08 PM Will provide medication refill.    Type II or unspecified type diabetes mellitus without mention of complication, not stated as uncontrolled - Plan: POCT glycosylated hemoglobin (Hb A1C), Lipid panel, Comprehensive metabolic panel, Microalbumin, urine, Ambulatory referral to Ophthalmology - Informed patient that his A1c today is 8.2.  Advised patient that he will be prescribed once daily Januvia.  Patient states he eats white basmati rice often and is advised to curtail eating so much rice.    Other and unspecified hyperlipidemia - Plan: POCT glycosylated hemoglobin (Hb A1C), Lipid panel, Comprehensive metabolic panel, Microalbumin, urine - It is evening but pt has not eating in 9 hours p will need to increase pravastatin form 20 to 40 so recheck cmp at f/u  Essential hypertension - Plan: POCT glycosylated hemoglobin (Hb A1C), Lipid panel, Comprehensive metabolic panel, Microalbumin, urine -  Losartan HCTZ and has not taken it in 4 days but his blood pressure is good today.  Discussed taking the HZTZ out of his medication and continuing him on the Losartan.   Encounter for long-term (current) use of other medications - Plan: POCT glycosylated hemoglobin (Hb A1C), Lipid panel, Comprehensive metabolic panel, Microalbumin, urine  Chest pain, unspecified chest pain type - Plan: metoprolol succinate (TOPROL-XL) 50 MG 24 hr tablet  Unspecified essential hypertension - Plan: metoprolol succinate (TOPROL-XL) 50 MG 24 hr tablet  Meds ordered this encounter  Medications  . pravastatin (PRAVACHOL) 20 MG tablet    Sig: Take 1 tablet (20 mg total) by mouth daily.    Dispense:  90 tablet    Refill:  1  . metoprolol succinate (TOPROL-XL) 50 MG 24 hr tablet    Sig: Take 1 tablet (50 mg total) by mouth daily. Take with or immediately following a meal.    Dispense:  90 tablet    Refill:  1  . metFORMIN (GLUCOPHAGE) 1000 MG tablet     Sig: Take 1 tablet (1,000 mg total) by mouth 2 (two) times daily with a meal.    Dispense:  180 tablet    Refill:  1  . losartan (COZAAR) 50 MG tablet    Sig: Take 1 tablet (50 mg total) by mouth daily.    Dispense:  90 tablet    Refill:  1  . glipiZIDE (GLUCOTROL XL) 10 MG 24 hr tablet    Sig: Take 1 tablet (10 mg total) by mouth daily.    Dispense:  90 tablet    Refill:  1  . sitaGLIPtin (JANUVIA) 100 MG tablet    Sig: Take 1 tablet (100 mg total) by mouth daily.    Dispense:  90 tablet    Refill:  1  I personally performed the services described in this documentation, which was scribed in my presence. The recorded information has been reviewed and considered, and addended by me as needed.  [name], MD MPH   Results for orders placed in visit on 02/22/14  LIPID PANEL      Result Value Ref Range   Cholesterol 219 (*) 0 - 200 mg/dL   Triglycerides 116  <150 mg/dL   HDL 55  >39 mg/dL   Total CHOL/HDL Ratio 4.0     VLDL 23  0 - 40 mg/dL   LDL Cholesterol 141 (*) 0 - 99 mg/dL  COMPREHENSIVE METABOLIC PANEL      Result Value Ref Range   Sodium 135  135 - 145 mEq/L   Potassium 4.4  3.5 - 5.3 mEq/L   Chloride 104  96 - 112 mEq/L   CO2 25  19 - 32 mEq/L   Glucose, Bld 130 (*) 70 - 99 mg/dL   BUN 16  6 - 23 mg/dL   Creat 1.27  0.50 - 1.35 mg/dL   Total Bilirubin 0.3  0.2 - 1.2 mg/dL   Alkaline Phosphatase 56  39 - 117 U/L   AST 19  0 - 37 U/L   ALT 18  0 - 53 U/L   Total Protein 7.3  6.0 - 8.3 g/dL   Albumin 4.2  3.5 - 5.2 g/dL   Calcium 9.7  8.4 - 10.5 mg/dL  MICROALBUMIN, URINE      Result Value Ref Range   Microalb, Ur 14.32 (*) 0.00 - 1.89 mg/dL  POCT GLYCOSYLATED HEMOGLOBIN (HGB A1C)      Result Value Ref Range   Hemoglobin A1C 8.2

## 2014-02-22 NOTE — Patient Instructions (Signed)
Diabetes and Standards of Medical Care Diabetes is complicated. You may find that your diabetes team includes a dietitian, nurse, diabetes educator, eye doctor, and more. To help everyone know what is going on and to help you get the care you deserve, the following schedule of care was developed to help keep you on track. Below are the tests, exams, vaccines, medicines, education, and plans you will need. HbA1c test This test shows how well you have controlled your glucose over the past 2-3 months. It is used to see if your diabetes management plan needs to be adjusted.   It is performed at least 2 times a year if you are meeting treatment goals.  It is performed 4 times a year if therapy has changed or if you are not meeting treatment goals. Blood pressure test  This test is performed at every routine medical visit. The goal is less than 140/90 mm Hg for most people, but 130/80 mm Hg in some cases. Ask your health care provider about your goal. Dental exam  Follow up with the dentist regularly. Eye exam  If you are diagnosed with type 1 diabetes as a child, get an exam upon reaching the age of 37 years or older and have had diabetes for 3-5 years. Yearly eye exams are recommended after that initial eye exam.  If you are diagnosed with type 1 diabetes as an adult, get an exam within 5 years of diagnosis and then yearly.  If you are diagnosed with type 2 diabetes, get an exam as soon as possible after the diagnosis and then yearly. Foot care exam  Visual foot exams are performed at every routine medical visit. The exams check for cuts, injuries, or other problems with the feet.  A comprehensive foot exam should be done yearly. This includes visual inspection as well as assessing foot pulses and testing for loss of sensation.  Check your feet nightly for cuts, injuries, or other problems with your feet. Tell your health care provider if anything is not healing. Kidney function test (urine  microalbumin)  This test is performed once a year.  Type 1 diabetes: The first test is performed 5 years after diagnosis.  Type 2 diabetes: The first test is performed at the time of diagnosis.  A serum creatinine and estimated glomerular filtration rate (eGFR) test is done once a year to assess the level of chronic kidney disease (CKD), if present. Lipid profile (cholesterol, HDL, LDL, triglycerides)  Performed every 5 years for most people.  The goal for LDL is less than 100 mg/dL. If you are at high risk, the goal is less than 70 mg/dL.  The goal for HDL is 40 mg/dL-50 mg/dL for men and 50 mg/dL-60 mg/dL for women. An HDL cholesterol of 60 mg/dL or higher gives some protection against heart disease.  The goal for triglycerides is less than 150 mg/dL. Influenza vaccine, pneumococcal vaccine, and hepatitis B vaccine  The influenza vaccine is recommended yearly.  It is recommended that people with diabetes who are over 24 years old get the pneumonia vaccine. In some cases, two separate shots may be given. Ask your health care provider if your pneumonia vaccination is up to date.  The hepatitis B vaccine is also recommended for adults with diabetes. Diabetes self-management education  Education is recommended at diagnosis and ongoing as needed. Treatment plan  Your treatment plan is reviewed at every medical visit. Document Released: 03/25/2009 Document Revised: 10/12/2013 Document Reviewed: 10/28/2012 [facility] Patient Information 2015 [place],  [place]. This information is not intended to replace advice given to you by your health care provider. Make sure you discuss any questions you have with your health care provider. Blood Glucose Monitoring Monitoring your blood glucose (also know as blood sugar) helps you to manage your diabetes. It also helps you and your health care provider monitor your diabetes and determine how well your treatment plan is working. WHY SHOULD YOU MONITOR  YOUR BLOOD GLUCOSE?  It can help you understand how food, exercise, and medicine affect your blood glucose.  It allows you to know what your blood glucose is at any given moment. You can quickly tell if you are having low blood glucose (hypoglycemia) or high blood glucose (hyperglycemia).  It can help you and your health care provider know how to adjust your medicines.  It can help you understand how to manage an illness or adjust medicine for exercise. WHEN SHOULD YOU TEST? Your health care provider will help you decide how often you should check your blood glucose. This may depend on the type of diabetes you have, your diabetes control, or the types of medicines you are taking. Be sure to write down all of your blood glucose readings so that this information can be reviewed with your health care provider. See below for examples of testing times that your health care provider may suggest. Type 1 Diabetes  Test 4 times a day if you are in good control, using an insulin pump, or perform multiple daily injections.  If your diabetes is not well controlled or if you are sick, you may need to monitor more often.  It is a good idea to also monitor:  Before and after exercise.  Between meals and 2 hours after a meal.  Occasionally between 2:00 a.m. and 3:00 a.m. Type 2 Diabetes  It can vary with each person, but generally, if you are on insulin, test 4 times a day.  If you take medicines by mouth (orally), test 2 times a day.  If you are on a controlled diet, test once a day.  If your diabetes is not well controlled or if you are sick, you may need to monitor more often. HOW TO MONITOR YOUR BLOOD GLUCOSE Supplies Needed  Blood glucose meter.  Test strips for your meter. Each meter has its own strips. You must use the strips that go with your own meter.  A pricking needle (lancet).  A device that holds the lancet (lancing device).  A journal or log book to write down your  results. Procedure  Wash your hands with soap and water. Alcohol is not preferred.  Prick the side of your finger (not the tip) with the lancet.  Gently milk the finger until a small drop of blood appears.  Follow the instructions that come with your meter for inserting the test strip, applying blood to the strip, and using your blood glucose meter. Other Areas to Get Blood for Testing Some meters allow you to use other areas of your body (other than your finger) to test your blood. These areas are called alternative sites. The most common alternative sites are:  The forearm.  The thigh.  The back area of the lower leg.  The palm of the hand. The blood flow in these areas is slower. Therefore, the blood glucose values you get may be delayed, and the numbers are different from what you would get from your fingers. Do not use alternative sites if you think you are having  hypoglycemia. Your reading will not be accurate. Always use a finger if you are having hypoglycemia. Also, if you cannot feel your lows (hypoglycemia unawareness), always use your fingers for your blood glucose checks. ADDITIONAL TIPS FOR GLUCOSE MONITORING  Do not reuse lancets.  Always carry your supplies with you.  All blood glucose meters have a 24-hour "hotline" number to call if you have questions or need help.  Adjust (calibrate) your blood glucose meter with a control solution after finishing a few boxes of strips. BLOOD GLUCOSE RECORD KEEPING It is a good idea to keep a daily record or log of your blood glucose readings. Most glucose meters, if not all, keep your glucose records stored in the meter. Some meters come with the ability to download your records to your home computer. Keeping a record of your blood glucose readings is especially helpful if you are wanting to look for patterns. Make notes to go along with the blood glucose readings because you might forget what happened at that exact time. Keeping  good records helps you and your health care provider to work together to achieve good diabetes management.  Document Released: 05/31/2003 Document Revised: 10/12/2013 Document Reviewed: 10/20/2012 [facility] Patient Information 2015 [place], [place]. This information is not intended to replace advice given to you by your health care provider. Make sure you discuss any questions you have with your health care provider. Diabetes Mellitus and Food It is important for you to manage your blood sugar (glucose) level. Your blood glucose level can be greatly affected by what you eat. Eating healthier foods in the appropriate amounts throughout the day at about the same time each day will help you control your blood glucose level. It can also help slow or prevent worsening of your diabetes mellitus. Healthy eating may even help you improve the level of your blood pressure and reach or maintain a healthy weight.  HOW CAN FOOD AFFECT ME? Carbohydrates Carbohydrates affect your blood glucose level more than any other type of food. Your dietitian will help you determine how many carbohydrates to eat at each meal and teach you how to count carbohydrates. Counting carbohydrates is important to keep your blood glucose at a healthy level, especially if you are using insulin or taking certain medicines for diabetes mellitus. Alcohol Alcohol can cause sudden decreases in blood glucose (hypoglycemia), especially if you use insulin or take certain medicines for diabetes mellitus. Hypoglycemia can be a life-threatening condition. Symptoms of hypoglycemia (sleepiness, dizziness, and disorientation) are similar to symptoms of having too much alcohol.  If your health care provider has given you approval to drink alcohol, do so in moderation and use the following guidelines:  Women should not have more than one drink per day, and men should not have more than two drinks per day. One drink is equal to:  12 oz of beer.  5 oz of  wine.  1 oz of hard liquor.  Do not drink on an empty stomach.  Keep yourself hydrated. Have water, diet soda, or unsweetened iced tea.  Regular soda, juice, and other mixers might contain a lot of carbohydrates and should be counted. WHAT FOODS ARE NOT RECOMMENDED? As you make food choices, it is important to remember that all foods are not the same. Some foods have fewer nutrients per serving than other foods, even though they might have the same number of calories or carbohydrates. It is difficult to get your body what it needs when you eat foods with fewer nutrients.  Examples of foods that you should avoid that are high in calories and carbohydrates but low in nutrients include:  Trans fats (most processed foods list trans fats on the Nutrition Facts label).  Regular soda.  Juice.  Candy.  Sweets, such as cake, pie, doughnuts, and cookies.  Fried foods. WHAT FOODS CAN I EAT? Have nutrient-rich foods, which will nourish your body and keep you healthy. The food you should eat also will depend on several factors, including:  The calories you need.  The medicines you take.  Your weight.  Your blood glucose level.  Your blood pressure level.  Your cholesterol level. You also should eat a variety of foods, including:  Protein, such as meat, poultry, fish, tofu, nuts, and seeds (lean animal proteins are best).  Fruits.  Vegetables.  Dairy products, such as milk, cheese, and yogurt (low fat is best).  Breads, grains, pasta, cereal, rice, and beans.  Fats such as olive oil, trans fat-free margarine, canola oil, avocado, and olives. DOES EVERYONE WITH DIABETES MELLITUS HAVE THE SAME MEAL PLAN? Because every person with diabetes mellitus is different, there is not one meal plan that works for everyone. It is very important that you meet with a dietitian who will help you create a meal plan that is just right for you. Document Released: 02/22/2005 Document Revised:  06/02/2013 Document Reviewed: 04/24/2013 [facility] Patient Information 2015 [place], [place]. This information is not intended to replace advice given to you by your health care provider. Make sure you discuss any questions you have with your health care provider.

## 2014-02-23 LAB — COMPREHENSIVE METABOLIC PANEL
ALT: 18 U/L (ref 0–53)
AST: 19 U/L (ref 0–37)
Albumin: 4.2 g/dL (ref 3.5–5.2)
Alkaline Phosphatase: 56 U/L (ref 39–117)
BUN: 16 mg/dL (ref 6–23)
CO2: 25 mEq/L (ref 19–32)
Calcium: 9.7 mg/dL (ref 8.4–10.5)
Chloride: 104 mEq/L (ref 96–112)
Creat: 1.27 mg/dL (ref 0.50–1.35)
Glucose, Bld: 130 mg/dL — ABNORMAL HIGH (ref 70–99)
Potassium: 4.4 mEq/L (ref 3.5–5.3)
Sodium: 135 mEq/L (ref 135–145)
Total Bilirubin: 0.3 mg/dL (ref 0.2–1.2)
Total Protein: 7.3 g/dL (ref 6.0–8.3)

## 2014-02-23 LAB — LIPID PANEL
Cholesterol: 219 mg/dL — ABNORMAL HIGH (ref 0–200)
HDL: 55 mg/dL (ref >39)
LDL Cholesterol: 141 mg/dL — ABNORMAL HIGH (ref 0–99)
Total CHOL/HDL Ratio: 4 Ratio
Triglycerides: 116 mg/dL (ref <150)
VLDL: 23 mg/dL (ref 0–40)

## 2014-02-23 LAB — MICROALBUMIN, URINE: Microalb, Ur: 14.32 mg/dL — ABNORMAL HIGH (ref 0.00–1.89)

## 2014-02-23 MED ORDER — PRAVASTATIN SODIUM 40 MG PO TABS: 40.0000 mg | ORAL_TABLET | Freq: Every day | ORAL | Status: DC

## 2014-03-02 NOTE — Progress Notes (Signed)
Left a message for patient to return call to schedule a 3 month follow up visit with Dr. Clelia Croft for diabetes.

## 2014-03-12 ENCOUNTER — Ambulatory Visit: Payer: BC Managed Care – PPO | Admitting: Family Medicine

## 2014-03-15 NOTE — Progress Notes (Signed)
LMVM to call back to schedule appt with Dr. Clelia CroftShaw.

## 2014-03-19 NOTE — Progress Notes (Signed)
Scheduled an appointment with Dr. Clelia CroftShaw on 1/15

## 2014-03-22 ENCOUNTER — Ambulatory Visit (INDEPENDENT_AMBULATORY_CARE_PROVIDER_SITE_OTHER): Payer: BC Managed Care – PPO | Admitting: Emergency Medicine

## 2014-03-22 VITALS — BP 132/86 | HR 69 | Temp 98.6°F | Resp 16 | Wt 220.8 lb

## 2014-03-22 DIAGNOSIS — K0889 Other specified disorders of teeth and supporting structures: Secondary | ICD-10-CM

## 2014-03-22 DIAGNOSIS — K088 Other specified disorders of teeth and supporting structures: Secondary | ICD-10-CM

## 2014-03-22 MED ORDER — AMOXICILLIN 875 MG PO TABS: 875.0000 mg | ORAL_TABLET | Freq: Two times a day (BID) | ORAL | Status: DC

## 2014-03-22 NOTE — Progress Notes (Signed)
   Subjective:    Patient ID: Jackson Mahoney, male    DOB: 01-20-1966, 48 y.o.   MRN: [id number] This chart was scribed for Lesle ChrisSteven Roman Sandall, MD by Jolene Provostobert Halas, Medical Scribe. This patient was seen in Room 11 and the patient's care was started at 12:41 PM.  HPI HPI Comments: Jackson Santeeelvin Fehringer is a 48 y.o. male with a past hx of gum infections who [facility] complaining of a gum infection that started two days ago. Pt endorses pain and swelling as associated symptoms. Pt states he has an appointment with the dentist in November, and would like a prescription for abx.    Review of Systems  Constitutional: Negative for fever.  HENT: Positive for dental problem and facial swelling. Negative for sore throat.   Musculoskeletal: Negative for neck pain.  Skin: Negative for color change.  Neurological: Negative for headaches.       Objective:   Physical Exam  Nursing note and vitals reviewed. Constitutional: He is oriented to person, place, and time. He appears well-developed and well-nourished.  HENT:  Head: Normocephalic and atraumatic.  Tender loose right upper k9 tooth.   Eyes: Pupils are equal, round, and reactive to light.  Neck: No JVD present.  Cardiovascular: Normal rate and regular rhythm.   Pulmonary/Chest: Effort normal and breath sounds normal. No respiratory distress.  Neurological: He is alert and oriented to person, place, and time.  Skin: Skin is warm and dry.  Psychiatric: He has a normal mood and affect. His behavior is normal.          Assessment & Plan:  Patient has a tooth he is about to lose. He is placed on amoxicillin and will followup with his dentist this week. I personally performed the services described in this documentation, which was scribed in my presence. The recorded information has been reviewed and is accurate.

## 2014-03-22 NOTE — Patient Instructions (Signed)
Dental Caries  Dental caries (also called tooth decay) is the most common oral disease. It can occur at any age but is more common in children and young adults.   HOW DENTAL CARIES DEVELOPS   The process of decay begins when bacteria and foods (particularly sugars and starches) combine in your mouth to produce plaque. Plaque is a substance that sticks to the hard, outer surface of a tooth (enamel). The bacteria in plaque produce acids that attack enamel. These acids may also attack the root surface of a tooth (cementum) if it is exposed. Repeated attacks dissolve these surfaces and create holes in the tooth (cavities). If left untreated, the acids destroy the other layers of the tooth.   RISK FACTORS   Frequent sipping of sugary beverages.    Frequent snacking on sugary and starchy foods, especially those that easily get stuck in the teeth.    Poor oral hygiene.    Dry mouth.    Substance abuse such as methamphetamine abuse.    Broken or poor-fitting dental restorations.    Eating disorders.    Gastroesophageal reflux disease (GERD).    Certain radiation treatments to the head and neck.  SYMPTOMS  In the early stages of dental caries, symptoms are seldom present. Sometimes white, chalky areas may be seen on the enamel or other tooth layers. In later stages, symptoms may include:   Pits and holes on the enamel.   Toothache after sweet, hot, or cold foods or drinks are consumed.   Pain around the tooth.   Swelling around the tooth.  DIAGNOSIS   Most of the time, dental caries is detected during a regular dental checkup. A diagnosis is made after a thorough medical and dental history is taken and the surfaces of your teeth are checked for signs of dental caries. Sometimes special instruments, such as lasers, are used to check for dental caries. Dental X-ray exams may be taken so that areas not visible to the eye (such as between the contact areas of the teeth) can be checked for cavities.    TREATMENT   If dental caries is in its early stages, it may be reversed with a fluoride treatment or an application of a remineralizing agent at the dental office. Thorough brushing and flossing at home is needed to aid these treatments. If it is in its later stages, treatment depends on the location and extent of tooth destruction:    If a small area of the tooth has been destroyed, the destroyed area will be removed and cavities will be filled with a material such as gold, silver amalgam, or composite resin.    If a large area of the tooth has been destroyed, the destroyed area will be removed and a cap (crown) will be fitted over the remaining tooth structure.    If the center part of the tooth (pulp) is affected, a procedure called a root canal will be needed before a filling or crown can be placed.    If most of the tooth has been destroyed, the tooth may need to be pulled (extracted).  HOME CARE INSTRUCTIONS  You can prevent, stop, or reverse dental caries at home by practicing good oral hygiene. Good oral hygiene includes:   Thoroughly cleaning your teeth at least twice a day with a toothbrush and dental floss.    Using a fluoride toothpaste. A fluoride mouth rinse may also be used if recommended by your dentist or health care provider.    Restricting   the amount of sugary and starchy foods and sugary liquids you consume.    Avoiding frequent snacking on these foods and sipping of these liquids.    Keeping regular visits with a dentist for checkups and cleanings.  PREVENTION    Practice good oral hygiene.   Consider a dental sealant. A dental sealant is a coating material that is applied by your dentist to the pits and grooves of teeth. The sealant prevents food from being trapped in them. It may protect the teeth for several years.   Ask about fluoride supplements if you live in a community without fluorinated water or with water that has a low fluoride content. Use fluoride supplements  as directed by your dentist or health care provider.   Allow fluoride varnish applications to teeth if directed by your dentist or health care provider.  Document Released: 02/17/2002 Document Revised: 10/12/2013 Document Reviewed: 05/30/2012  [employer] Patient Information 2015 [employer]. This information is not intended to replace advice given to you by your health care provider. Make sure you discuss any questions you have with your health care provider.

## 2014-04-04 ENCOUNTER — Encounter: Payer: Self-pay | Admitting: Family Medicine

## 2014-06-25 ENCOUNTER — Ambulatory Visit: Payer: Self-pay | Admitting: Family Medicine

## 2014-08-18 ENCOUNTER — Other Ambulatory Visit: Payer: Self-pay | Admitting: Family Medicine

## 2014-09-03 ENCOUNTER — Other Ambulatory Visit: Payer: Self-pay | Admitting: Family Medicine

## 2014-10-13 ENCOUNTER — Other Ambulatory Visit: Payer: Self-pay | Admitting: Family Medicine

## 2014-10-15 ENCOUNTER — Other Ambulatory Visit: Payer: Self-pay | Admitting: Family Medicine

## 2014-11-02 ENCOUNTER — Other Ambulatory Visit: Payer: Self-pay | Admitting: Family Medicine

## 2014-11-15 ENCOUNTER — Other Ambulatory Visit: Payer: Self-pay | Admitting: Family Medicine

## 2014-11-26 ENCOUNTER — Telehealth: Payer: Self-pay | Admitting: Physician Assistant

## 2014-11-26 DIAGNOSIS — IMO0002 Reserved for concepts with insufficient information to code with codable children: Secondary | ICD-10-CM

## 2014-11-26 DIAGNOSIS — E1165 Type 2 diabetes mellitus with hyperglycemia: Secondary | ICD-10-CM

## 2014-11-29 NOTE — Telephone Encounter (Signed)
Pt has not been seen here in 8 months. Diabetes, esp uncontrolled diabetes like he has, needs to be seen at least every 3 months. I refilled #30 pills - he needs to return for further refills.

## 2014-11-29 NOTE — Telephone Encounter (Signed)
Spoke with pt, advised he needs to [facility] for more refills. Pt understood.

## 2014-11-29 NOTE — Telephone Encounter (Signed)
Last refill in March, it seems pt is not compliant with medication. Advise on refill.

## 2014-12-23 ENCOUNTER — Ambulatory Visit (INDEPENDENT_AMBULATORY_CARE_PROVIDER_SITE_OTHER): Payer: BLUE CROSS/BLUE SHIELD | Admitting: Emergency Medicine

## 2014-12-23 VITALS — BP 122/80 | HR 81 | Temp 98.9°F | Resp 16 | Ht 71.0 in | Wt 219.2 lb

## 2014-12-23 DIAGNOSIS — IMO0002 Reserved for concepts with insufficient information to code with codable children: Secondary | ICD-10-CM

## 2014-12-23 DIAGNOSIS — I10 Essential (primary) hypertension: Secondary | ICD-10-CM | POA: Diagnosis not present

## 2014-12-23 DIAGNOSIS — E119 Type 2 diabetes mellitus without complications: Secondary | ICD-10-CM | POA: Diagnosis not present

## 2014-12-23 DIAGNOSIS — E782 Mixed hyperlipidemia: Secondary | ICD-10-CM | POA: Diagnosis not present

## 2014-12-23 DIAGNOSIS — E1165 Type 2 diabetes mellitus with hyperglycemia: Secondary | ICD-10-CM

## 2014-12-23 LAB — POCT GLYCOSYLATED HEMOGLOBIN (HGB A1C): Hemoglobin A1C: 9.8

## 2014-12-23 LAB — GLUCOSE, POCT (MANUAL RESULT ENTRY): POC Glucose: 289 mg/dl — AB (ref 70–99)

## 2014-12-23 MED ORDER — GLIMEPIRIDE 4 MG PO TABS: 4.0000 mg | ORAL_TABLET | Freq: Every day | ORAL | Status: DC

## 2014-12-23 MED ORDER — METOPROLOL SUCCINATE ER 50 MG PO TB24: 50.0000 mg | ORAL_TABLET | Freq: Every day | ORAL | Status: DC

## 2014-12-23 MED ORDER — PRAVASTATIN SODIUM 40 MG PO TABS: 40.0000 mg | ORAL_TABLET | Freq: Every day | ORAL | Status: DC

## 2014-12-23 MED ORDER — METFORMIN HCL 1000 MG PO TABS: 1000.0000 mg | ORAL_TABLET | Freq: Two times a day (BID) | ORAL | Status: DC

## 2014-12-23 MED ORDER — LOSARTAN POTASSIUM 50 MG PO TABS: 50.0000 mg | ORAL_TABLET | Freq: Every day | ORAL | Status: DC

## 2014-12-23 NOTE — Progress Notes (Signed)
Subjective:  Patient ID: Jackson Mahoney, male    DOB: [birth year]  Age: 49 y.o. MRN: [id number]  CC: Medication Refill   HPI Jackson Mahoney presents  for refills of his medication. He has a history of hypertension hypercholesterolemia and type 2 diabetes. He is tolerating his medication with no adverse side effects. His presents with no treatment of an endorgan dysfunction. He has no other complaints.  History Jackson Mahoney has a past medical history of Diabetes mellitus without complication; Hyperlipidemia; and Hypertension.   He has no past surgical history on file.   His  family history includes Diabetes in his father.  He   reports that he has never smoked. He does not have any smokeless tobacco history on file. He reports that he drinks about 1.2 oz of alcohol per week. He reports that he does not use illicit drugs.  Outpatient Prescriptions Prior to Visit  Medication Sig Dispense Refill  . aspirin 81 MG EC tablet Take 81 mg by mouth daily. Swallow whole.    Marland Kitchen glipiZIDE (GLUCOTROL XL) 10 MG 24 hr tablet TAKE 1 TABLET BY MOUTH DAILY.  "NO MORE REFILLS WITHOUT OV" 30 tablet 0  . losartan (COZAAR) 50 MG tablet Take 1 tablet (50 mg total) by mouth daily. NO MORE REFILLS WITHOUT OFFICE VISIT - 2ND NOTICE 15 tablet 0  . metoprolol succinate (TOPROL-XL) 50 MG 24 hr tablet Take 1 tablet (50 mg total) by mouth daily. NO MORE REFILLS WITHOUT OFFICE VISIT - 2ND NOTICE 15 tablet 0  . losartan (COZAAR) 50 MG tablet TAKE 1 TABLET(50 MG) BY MOUTH DAILY 7 tablet 0  . metFORMIN (GLUCOPHAGE) 1000 MG tablet Take 1 tablet (1,000 mg total) by mouth daily with breakfast. NEEDS OV FOR MORE REFILLS 2ND NOTICE 30 tablet 0  . metoprolol succinate (TOPROL-XL) 50 MG 24 hr tablet Take 1 tablet (50 mg total) by mouth daily. Take with or immediately following a meal. 90 tablet 1  . pravastatin (PRAVACHOL) 40 MG tablet Take 1 tablet (40 mg total) by mouth daily. 90 tablet 1  . amoxicillin (AMOXIL) 875 MG tablet Take 1  tablet (875 mg total) by mouth 2 (two) times daily. (Patient not taking: Reported on 12/23/2014) 20 tablet 0  . glucose monitoring kit (FREESTYLE) monitoring kit 1 each by Does not apply route as needed for other. 1 each 0  . sitaGLIPtin (JANUVIA) 100 MG tablet Take 1 tablet (100 mg total) by mouth daily. (Patient not taking: Reported on 12/23/2014) 90 tablet 1   No facility-administered medications prior to visit.    History   Social History  . Marital Status: Single    Spouse Name: N/A  . Number of Children: N/A  . Years of Education: N/A   Social History Main Topics  . Smoking status: Never Smoker   . Smokeless tobacco: Not on file  . Alcohol Use: 1.2 oz/week    2 Cans of beer per week  . Drug Use: No  . Sexual Activity: No   Other Topics Concern  . None   Social History Narrative     Review of Systems  Constitutional: Negative for fever, chills and appetite change.  HENT: Negative for congestion, ear pain, postnasal drip, sinus pressure and sore throat.   Eyes: Negative for pain and redness.  Respiratory: Negative for cough, shortness of breath and wheezing.   Cardiovascular: Negative for leg swelling.  Gastrointestinal: Negative for nausea, vomiting, abdominal pain, diarrhea, constipation and blood in stool.  Endocrine: Negative for polyuria.  Genitourinary: Negative for dysuria, urgency, frequency and flank pain.  Musculoskeletal: Negative for gait problem.  Skin: Negative for rash.  Neurological: Negative for weakness and headaches.  Psychiatric/Behavioral: Negative for confusion and decreased concentration. The patient is not nervous/anxious.     Objective:  BP 122/80 mmHg  Pulse 81  Temp(Src) 98.9 F (37.2 C) (Oral)  Resp 16  Ht _0  (1.803 m)  Wt 219 lb 3.2 oz (99.428 kg)  BMI 30.59 kg/m2  SpO2 98%  Physical Exam  Constitutional: He is oriented to person, place, and time. He appears well-developed and well-nourished.  HENT:  Head: Normocephalic and  atraumatic.  Eyes: Conjunctivae are normal. Pupils are equal, round, and reactive to light.  Pulmonary/Chest: Effort normal.  Musculoskeletal: He exhibits no edema.  Neurological: He is alert and oriented to person, place, and time.  Skin: Skin is dry.  Psychiatric: He has a normal mood and affect. His behavior is normal. Thought content normal.      Assessment & Plan:   Jackson Mahoney was seen today for medication refill.  Diagnoses and all orders for this visit:  Essential hypertension Orders: -     POCT glucose (manual entry) -     POCT glycosylated hemoglobin (Hb A1C) -     CBC with Differential/Platelet -     Comprehensive metabolic panel -     Lipid panel  Diabetes mellitus without complication Orders: -     POCT glucose (manual entry) -     POCT glycosylated hemoglobin (Hb A1C) -     CBC with Differential/Platelet -     Comprehensive metabolic panel -     Lipid panel  Mixed hyperlipidemia Orders: -     POCT glucose (manual entry) -     POCT glycosylated hemoglobin (Hb A1C) -     CBC with Differential/Platelet -     Comprehensive metabolic panel -     Lipid panel  Chest pain, unspecified chest pain type Orders: -     metoprolol succinate (TOPROL-XL) 50 MG 24 hr tablet; Take 1 tablet (50 mg total) by mouth daily. Take with or immediately following a meal.  Essential hypertension, benign Orders: -     metoprolol succinate (TOPROL-XL) 50 MG 24 hr tablet; Take 1 tablet (50 mg total) by mouth daily. Take with or immediately following a meal.  Diabetes type 2, uncontrolled Orders: -     metFORMIN (GLUCOPHAGE) 1000 MG tablet; Take 1 tablet (1,000 mg total) by mouth 2 (two) times daily with a meal. NEEDS OV FOR MORE REFILLS 2ND NOTICE  Other orders -     pravastatin (PRAVACHOL) 40 MG tablet; Take 1 tablet (40 mg total) by mouth daily. -     losartan (COZAAR) 50 MG tablet; Take 1 tablet (50 mg total) by mouth daily.   I have changed Jackson Mahoney metFORMIN and losartan. I  am also having him maintain his aspirin, glucose monitoring kit, sitaGLIPtin, amoxicillin, metoprolol succinate, losartan, glipiZIDE, pravastatin, and metoprolol succinate.  Meds ordered this encounter  Medications  . pravastatin (PRAVACHOL) 40 MG tablet    Sig: Take 1 tablet (40 mg total) by mouth daily.    Dispense:  30 tablet    Refill:  5    To replace prior dose of 20 mg qd  . metoprolol succinate (TOPROL-XL) 50 MG 24 hr tablet    Sig: Take 1 tablet (50 mg total) by mouth daily. Take with or immediately following a meal.    Dispense:  30 tablet  Refill:  5  . metFORMIN (GLUCOPHAGE) 1000 MG tablet    Sig: Take 1 tablet (1,000 mg total) by mouth 2 (two) times daily with a meal. NEEDS OV FOR MORE REFILLS 2ND NOTICE    Dispense:  60 tablet    Refill:  5  . losartan (COZAAR) 50 MG tablet    Sig: Take 1 tablet (50 mg total) by mouth daily.    Dispense:  30 tablet    Refill:  5    Needs office visit   His sugars poorly controlled with a hemoglobin A1c of 9.8 of increased his Glucophage 1000 mg today and prescribed glyburide 4 mg. He'll follow-up in 3 months  Appropriate red flag conditions were discussed with the patient as well as actions that should be taken.  Patient expressed his understanding.  Follow-up: No Follow-up on file.  Roselee Culver, MD  Results for orders placed or performed in visit on 12/23/14  POCT glucose (manual entry)  Result Value Ref Range   POC Glucose 289 (A) 70 - 99 mg/dl  POCT glycosylated hemoglobin (Hb A1C)  Result Value Ref Range   Hemoglobin A1C 9.8

## 2014-12-23 NOTE — Patient Instructions (Signed)

## 2014-12-24 LAB — CBC WITH DIFFERENTIAL/PLATELET
Basophils Absolute: 0 10*3/uL (ref 0.0–0.1)
Basophils Relative: 0 % (ref 0–1)
Eosinophils Absolute: 0.1 10*3/uL (ref 0.0–0.7)
Eosinophils Relative: 2 % (ref 0–5)
HCT: 35.8 % — ABNORMAL LOW (ref 39.0–52.0)
Hemoglobin: 11.9 g/dL — ABNORMAL LOW (ref 13.0–17.0)
Lymphocytes Relative: 34 % (ref 12–46)
Lymphs Abs: 1.8 10*3/uL (ref 0.7–4.0)
MCH: 27.6 pg (ref 26.0–34.0)
MCHC: 33.2 g/dL (ref 30.0–36.0)
MCV: 83.1 fL (ref 78.0–100.0)
MPV: 11.3 fL (ref 8.6–12.4)
Monocytes Absolute: 0.3 10*3/uL (ref 0.1–1.0)
Monocytes Relative: 5 % (ref 3–12)
Neutro Abs: 3.2 10*3/uL (ref 1.7–7.7)
Neutrophils Relative %: 59 % (ref 43–77)
Platelets: 221 10*3/uL (ref 150–400)
RBC: 4.31 MIL/uL (ref 4.22–5.81)
RDW: 14.1 % (ref 11.5–15.5)
WBC: 5.4 10*3/uL (ref 4.0–10.5)

## 2014-12-24 LAB — COMPREHENSIVE METABOLIC PANEL
ALT: 27 U/L (ref 0–53)
AST: 34 U/L (ref 0–37)
Albumin: 3.9 g/dL (ref 3.5–5.2)
Alkaline Phosphatase: 64 U/L (ref 39–117)
BUN: 15 mg/dL (ref 6–23)
CO2: 26 mEq/L (ref 19–32)
Calcium: 9.4 mg/dL (ref 8.4–10.5)
Chloride: 102 mEq/L (ref 96–112)
Creat: 1.16 mg/dL (ref 0.50–1.35)
Glucose, Bld: 279 mg/dL — ABNORMAL HIGH (ref 70–99)
Potassium: 4.5 mEq/L (ref 3.5–5.3)
Sodium: 134 mEq/L — ABNORMAL LOW (ref 135–145)
Total Bilirubin: 0.3 mg/dL (ref 0.2–1.2)
Total Protein: 6.9 g/dL (ref 6.0–8.3)

## 2014-12-24 LAB — LIPID PANEL
Cholesterol: 187 mg/dL (ref 0–200)
HDL: 53 mg/dL (ref >=40)
LDL Cholesterol: 119 mg/dL — ABNORMAL HIGH (ref 0–99)
Total CHOL/HDL Ratio: 3.5 Ratio
Triglycerides: 76 mg/dL (ref <150)
VLDL: 15 mg/dL (ref 0–40)

## 2014-12-29 ENCOUNTER — Encounter: Payer: Self-pay | Admitting: Family Medicine

## 2015-02-22 ENCOUNTER — Other Ambulatory Visit: Payer: Self-pay | Admitting: Family Medicine

## 2015-05-02 ENCOUNTER — Other Ambulatory Visit: Payer: Self-pay | Admitting: Emergency Medicine

## 2015-06-13 ENCOUNTER — Other Ambulatory Visit: Payer: Self-pay | Admitting: Emergency Medicine

## 2015-06-13 ENCOUNTER — Other Ambulatory Visit: Payer: Self-pay | Admitting: Physician Assistant

## 2015-06-14 NOTE — Telephone Encounter (Signed)
Patient advised needs a follow up for refills.  Understood

## 2015-07-08 ENCOUNTER — Ambulatory Visit (INDEPENDENT_AMBULATORY_CARE_PROVIDER_SITE_OTHER): Payer: BLUE CROSS/BLUE SHIELD | Admitting: Emergency Medicine

## 2015-07-08 VITALS — BP 124/72 | HR 75 | Temp 98.1°F | Resp 18 | Ht 72.0 in | Wt 216.0 lb

## 2015-07-08 DIAGNOSIS — I10 Essential (primary) hypertension: Secondary | ICD-10-CM | POA: Diagnosis not present

## 2015-07-08 DIAGNOSIS — E785 Hyperlipidemia, unspecified: Secondary | ICD-10-CM | POA: Diagnosis not present

## 2015-07-08 DIAGNOSIS — Z125 Encounter for screening for malignant neoplasm of prostate: Secondary | ICD-10-CM

## 2015-07-08 DIAGNOSIS — E119 Type 2 diabetes mellitus without complications: Secondary | ICD-10-CM

## 2015-07-08 DIAGNOSIS — Z1159 Encounter for screening for other viral diseases: Secondary | ICD-10-CM

## 2015-07-08 LAB — POCT CBC
Granulocyte percent: 47 %G (ref 37–80)
HCT, POC: 38.2 % — AB (ref 43.5–53.7)
Hemoglobin: 13 g/dL — AB (ref 14.1–18.1)
Lymph, poc: 2.4 (ref 0.6–3.4)
MCH, POC: 28.1 pg (ref 27–31.2)
MCHC: 34 g/dL (ref 31.8–35.4)
MCV: 82.4 fL (ref 80–97)
MID (cbc): 0.4 (ref 0–0.9)
MPV: 8.4 fL (ref 0–99.8)
POC Granulocyte: 2.5 (ref 2–6.9)
POC LYMPH PERCENT: 45.6 %L (ref 10–50)
POC MID %: 7.4 %M (ref 0–12)
Platelet Count, POC: 250 10*3/uL (ref 142–424)
RBC: 4.63 M/uL — AB (ref 4.69–6.13)
RDW, POC: 14.2 %
WBC: 5.3 10*3/uL (ref 4.6–10.2)

## 2015-07-08 LAB — LIPID PANEL
Cholesterol: 220 mg/dL — ABNORMAL HIGH (ref 125–200)
HDL: 55 mg/dL (ref >=40)
LDL Cholesterol: 148 mg/dL — ABNORMAL HIGH (ref <130)
Total CHOL/HDL Ratio: 4 Ratio (ref <=5.0)
Triglycerides: 86 mg/dL (ref <150)
VLDL: 17 mg/dL (ref <30)

## 2015-07-08 LAB — COMPLETE METABOLIC PANEL WITH GFR
ALT: 11 U/L (ref 9–46)
AST: 15 U/L (ref 10–40)
Albumin: 3.6 g/dL (ref 3.6–5.1)
Alkaline Phosphatase: 51 U/L (ref 40–115)
BUN: 16 mg/dL (ref 7–25)
CO2: 27 mmol/L (ref 20–31)
Calcium: 9.3 mg/dL (ref 8.6–10.3)
Chloride: 102 mmol/L (ref 98–110)
Creat: 1.15 mg/dL (ref 0.60–1.35)
GFR, Est African American: 86 mL/min (ref >=60)
GFR, Est Non African American: 74 mL/min (ref >=60)
Glucose, Bld: 175 mg/dL — ABNORMAL HIGH (ref 65–99)
Potassium: 4.8 mmol/L (ref 3.5–5.3)
Sodium: 136 mmol/L (ref 135–146)
Total Bilirubin: 0.3 mg/dL (ref 0.2–1.2)
Total Protein: 6.9 g/dL (ref 6.1–8.1)

## 2015-07-08 LAB — GLUCOSE, POCT (MANUAL RESULT ENTRY): POC Glucose: 180 mg/dl — AB (ref 70–99)

## 2015-07-08 LAB — POCT GLYCOSYLATED HEMOGLOBIN (HGB A1C): Hemoglobin A1C: 8.6

## 2015-07-08 MED ORDER — GLIMEPIRIDE 4 MG PO TABS: ORAL_TABLET | ORAL | Status: DC

## 2015-07-08 NOTE — Patient Instructions (Signed)

## 2015-07-08 NOTE — Progress Notes (Signed)
By signing my name below, I, Raven Small, attest that this documentation has been prepared under the direction and in the presence of Arlyss Queen, MD.  Electronically Signed: Thea Alken, ED Scribe. 07/08/2015. 11:59 AM.  Chief Complaint:  Chief Complaint  Patient presents with  . Medication Refill    All. Pt is fasting  . Follow-up   HPI: Jackson Mahoney is a 50 y.o. male who reports to [facility] today for a medication refill. Pt has been doing well. He does not check sugar outside of office. He states he has been taking his medication. He has cut back on carbs, especially rice.   Lab Results  Component Value Date   HGBA1C 9.8 12/23/2014   Lab Results  Component Value Date   CHOL 187 12/23/2014   HDL 53 12/23/2014   LDLCALC 119* 12/23/2014   TRIG 76 12/23/2014   CHOLHDL 3.5 12/23/2014   He received flu shot 3 months ago.   Pt is from [place].   Past Medical History  Diagnosis Date  . Diabetes mellitus without complication ([place])   . Hyperlipidemia   . Hypertension    History reviewed. No pertinent past surgical history. Social History   Social History  . Marital Status: Single    Spouse Name: N/A  . Number of Children: N/A  . Years of Education: N/A   Social History Main Topics  . Smoking status: Never Smoker   . Smokeless tobacco: None  . Alcohol Use: 1.2 oz/week    2 Cans of beer per week  . Drug Use: No  . Sexual Activity: No   Other Topics Concern  . None   Social History Narrative   Family History  Problem Relation Age of Onset  . Diabetes Father    Allergies  Allergen Reactions  . Wasp Venom Anaphylaxis  . Chloroquine Itching   Prior to Admission medications   Medication Sig Start Date End Date Taking? Authorizing Provider  aspirin 81 MG EC tablet Take 81 mg by mouth daily. Swallow whole.   Yes Historical Provider, MD  glimepiride (AMARYL) 4 MG tablet Take 1 tablet (4 mg) by mouth daily with breakfast 06/14/15  Yes Roselee Culver, MD  losartan  (COZAAR) 50 MG tablet Take 1 tablet (50 mg total) by mouth daily. NO MORE REFILLS WITHOUT OFFICE VISIT - 2ND NOTICE 10/15/14  Yes Robyn Haber, MD  losartan (COZAAR) 50 MG tablet Take 1 tablet (50 mg total) by mouth daily. 12/23/14  Yes Roselee Culver, MD  metFORMIN (GLUCOPHAGE) 1000 MG tablet Take 1 tablet (1,000 mg total) by mouth 2 (two) times daily with a meal. NEEDS OV FOR MORE REFILLS 2ND NOTICE 12/23/14  Yes Roselee Culver, MD  pravastatin (PRAVACHOL) 40 MG tablet Take 1 tablet (40 mg total) by mouth daily. 12/23/14  Yes Roselee Culver, MD  amoxicillin (AMOXIL) 875 MG tablet Take 1 tablet (875 mg total) by mouth 2 (two) times daily. Patient not taking: Reported on 12/23/2014 03/22/14   Darlyne Russian, MD  glipiZIDE (GLUCOTROL XL) 10 MG 24 hr tablet TAKE 1 TABLET BY MOUTH DAILY.  "NO MORE REFILLS WITHOUT OV" Patient not taking: Reported on 07/08/2015 11/16/14   Harrison Mons, PA-C  glucose monitoring kit (FREESTYLE) monitoring kit 1 each by Does not apply route as needed for other. 07/25/12 07/25/13  Posey Boyer, MD  metFORMIN (GLUCOPHAGE) 1000 MG tablet Take 1 tablet (1,000 mg total) by mouth daily. PATIENT NEEDS AN OFFICE VISIT FOR ADDITIONAL  REFILLS. Patient not taking: Reported on 07/08/2015 06/13/15   Robyn Haber, MD  metoprolol succinate (TOPROL-XL) 50 MG 24 hr tablet Take 1 tablet (50 mg total) by mouth daily. NO MORE REFILLS WITHOUT OFFICE VISIT - 2ND NOTICE Patient not taking: Reported on 07/08/2015 10/15/14   Mancel Bale, PA-C  metoprolol succinate (TOPROL-XL) 50 MG 24 hr tablet Take 1 tablet (50 mg total) by mouth daily. Take with or immediately following a meal. Patient not taking: Reported on 07/08/2015 12/23/14   Roselee Culver, MD  sitaGLIPtin (JANUVIA) 100 MG tablet Take 1 tablet (100 mg total) by mouth daily. Patient not taking: Reported on 12/23/2014 02/22/14   Shawnee Knapp, MD   ROS: The patient denies fevers, chills, night sweats, unintentional weight loss, chest  pain, palpitations, wheezing, dyspnea on exertion, nausea, vomiting, abdominal pain, dysuria, hematuria, melena, numbness, weakness, or tingling.   All other systems have been reviewed and were otherwise negative with the exception of those mentioned in the HPI and as above.    PHYSICAL EXAM: Filed Vitals:   07/08/15 1150  BP: 124/72  Pulse: 75  Temp: 98.1 F (36.7 C)  Resp: 18   Body mass index is 29.29 kg/(m^2).   General: Alert, no acute distress HEENT:  Normocephalic, atraumatic, oropharynx patent. Eye: Juliette Mangle [facility] Cardiovascular:  Regular rate and rhythm, no rubs murmurs or gallops.  No Carotid bruits, radial pulse intact. No pedal edema.  Respiratory: Clear to auscultation bilaterally.  No wheezes, rales, or rhonchi.  No cyanosis, no use of accessory musculature Abdominal: No organomegaly, abdomen is soft and non-tender, positive bowel sounds.  No masses. Musculoskeletal: Gait intact. No edema, tenderness Skin: No rashes. Neurologic: Facial musculature symmetric. Psychiatric: Patient acts appropriately throughout our interaction. Lymphatic: No cervical or submandibular lymphadenopathy   LABS: Results for orders placed or performed in visit on 07/08/15  POCT CBC  Result Value Ref Range   WBC 5.3 4.6 - 10.2 K/uL   Lymph, poc 2.4 0.6 - 3.4   POC LYMPH PERCENT 45.6 10 - 50 %L   MID (cbc) 0.4 0 - 0.9   POC MID % 7.4 0 - 12 %M   POC Granulocyte 2.5 2 - 6.9   Granulocyte percent 47.0 37 - 80 %G   RBC 4.63 (A) 4.69 - 6.13 M/uL   Hemoglobin 13.0 (A) 14.1 - 18.1 g/dL   HCT, POC 38.2 (A) 43.5 - 53.7 %   MCV 82.4 80 - 97 fL   MCH, POC 28.1 27 - 31.2 pg   MCHC 34.0 31.8 - 35.4 g/dL   RDW, POC 14.2 %   Platelet Count, POC 250 142 - 424 K/uL   MPV 8.4 0 - 99.8 fL  POCT glucose (manual entry)  Result Value Ref Range   POC Glucose 180 (A) 70 - 99 mg/dl  POCT glycosylated hemoglobin (Hb A1C)  Result Value Ref Range   Hemoglobin A1C 8.6     ASSESSMENT/PLAN: Diabetes is  not at goal. Patient needs to start checking his sugars every day and record. He needs to take his medications that he is prescribed regularly. He needs to work on weight loss and exercise and decrease 10 pounds prior to his next visit. He needs to recheck in 3 months.I personally performed the services described in this documentation, which was scribed in my presence. The recorded information has been reviewed and is accurate.   Gross sideeffects, risk and benefits, and alternatives of medications d/w patient. Patient is aware that all  medications have potential sideeffects and we are unable to predict every sideeffect or drug-drug interaction that may occur.  Arlyss Queen MD 07/08/2015 11:59 AM

## 2015-07-09 LAB — HEPATITIS C ANTIBODY: HCV Ab: NEGATIVE

## 2015-07-09 LAB — PSA: PSA: 0.39 ng/mL (ref <=4.00)

## 2015-07-09 LAB — MICROALBUMIN, URINE: Microalb, Ur: 142.7 mg/dL

## 2015-07-10 ENCOUNTER — Other Ambulatory Visit: Payer: Self-pay | Admitting: Emergency Medicine

## 2015-07-22 ENCOUNTER — Telehealth: Payer: Self-pay | Admitting: Family Medicine

## 2015-07-22 ENCOUNTER — Other Ambulatory Visit: Payer: Self-pay | Admitting: Emergency Medicine

## 2015-07-22 MED ORDER — PRAVASTATIN SODIUM 40 MG PO TABS: 40.0000 mg | ORAL_TABLET | Freq: Every day | ORAL | Status: DC

## 2015-07-22 MED ORDER — GLIMEPIRIDE 4 MG PO TABS
ORAL_TABLET | ORAL | Status: DC
Start: 2015-07-22 — End: 2016-07-27

## 2015-07-22 MED ORDER — METFORMIN HCL 1000 MG PO TABS: 1000.0000 mg | ORAL_TABLET | Freq: Two times a day (BID) | ORAL | Status: DC

## 2015-07-22 MED ORDER — GLIPIZIDE ER 10 MG PO TB24
ORAL_TABLET | ORAL | Status: DC
Start: 2015-07-22 — End: 2016-08-06

## 2015-07-22 MED ORDER — LOSARTAN POTASSIUM 50 MG PO TABS: 50.0000 mg | ORAL_TABLET | Freq: Every day | ORAL | Status: DC

## 2015-07-22 NOTE — Telephone Encounter (Signed)
Patient calling requesting refill of medications.  Nees refill of Pravastatin, glipizide, etc.  Just evaluated by [facility] on 07/08/15.  To [facility] to determine which medications need to be refilled; patient is out of all medications.

## 2015-09-17 ENCOUNTER — Other Ambulatory Visit: Payer: Self-pay | Admitting: Emergency Medicine

## 2015-11-02 DIAGNOSIS — E119 Type 2 diabetes mellitus without complications: Secondary | ICD-10-CM | POA: Diagnosis not present

## 2015-11-02 DIAGNOSIS — Z111 Encounter for screening for respiratory tuberculosis: Secondary | ICD-10-CM | POA: Diagnosis not present

## 2015-11-02 DIAGNOSIS — Z23 Encounter for immunization: Secondary | ICD-10-CM | POA: Diagnosis not present

## 2015-11-02 DIAGNOSIS — G4700 Insomnia, unspecified: Secondary | ICD-10-CM | POA: Diagnosis not present

## 2015-11-02 DIAGNOSIS — Z0000 Encounter for general adult medical examination without abnormal findings: Secondary | ICD-10-CM | POA: Diagnosis not present

## 2015-11-02 DIAGNOSIS — Z125 Encounter for screening for malignant neoplasm of prostate: Secondary | ICD-10-CM | POA: Diagnosis not present

## 2015-12-18 DIAGNOSIS — S91102A Unspecified open wound of left great toe without damage to nail, initial encounter: Secondary | ICD-10-CM | POA: Diagnosis not present

## 2016-02-19 ENCOUNTER — Other Ambulatory Visit: Payer: Self-pay | Admitting: Family Medicine

## 2016-03-22 DIAGNOSIS — Z23 Encounter for immunization: Secondary | ICD-10-CM | POA: Diagnosis not present

## 2016-03-26 DIAGNOSIS — E119 Type 2 diabetes mellitus without complications: Secondary | ICD-10-CM | POA: Diagnosis not present

## 2016-03-26 DIAGNOSIS — A609 Anogenital herpesviral infection, unspecified: Secondary | ICD-10-CM | POA: Diagnosis not present

## 2016-03-26 DIAGNOSIS — G4700 Insomnia, unspecified: Secondary | ICD-10-CM | POA: Diagnosis not present

## 2016-03-26 DIAGNOSIS — Z23 Encounter for immunization: Secondary | ICD-10-CM | POA: Diagnosis not present

## 2016-04-18 DIAGNOSIS — H5213 Myopia, bilateral: Secondary | ICD-10-CM | POA: Diagnosis not present

## 2016-04-18 DIAGNOSIS — E119 Type 2 diabetes mellitus without complications: Secondary | ICD-10-CM | POA: Diagnosis not present

## 2016-06-26 ENCOUNTER — Ambulatory Visit (INDEPENDENT_AMBULATORY_CARE_PROVIDER_SITE_OTHER): Payer: BLUE CROSS/BLUE SHIELD | Admitting: Family Medicine

## 2016-06-26 VITALS — BP 155/80 | HR 84 | Temp 98.5°F | Resp 18 | Ht 72.0 in | Wt 215.0 lb

## 2016-06-26 DIAGNOSIS — H5711 Ocular pain, right eye: Secondary | ICD-10-CM

## 2016-06-26 DIAGNOSIS — H1031 Unspecified acute conjunctivitis, right eye: Secondary | ICD-10-CM | POA: Diagnosis not present

## 2016-06-26 MED ORDER — CIPROFLOXACIN HCL 0.3 % OP SOLN: OPHTHALMIC | 0 refills | Status: DC

## 2016-06-26 NOTE — Progress Notes (Signed)
By signing my name below, I, Mesha Guinyard, attest that this documentation has been prepared under the direction and in the presence of Merri Ray, MD.  Electronically Signed: Verlee Monte, Medical Scribe. 06/26/16. 3:39 PM.  Subjective:    Patient ID: Jackson Mahoney, male    DOB: 1965-08-16, 51 y.o.   MRN: [id number]  HPI Chief Complaint  Patient presents with  . Conjunctivitis    HPI Comments: Jackson Mahoney is a 51 y.o. male who presents to the [facility] complaining of worsening right eye redness onset 3 days ago. Reports associated sxs of HA, photophobia that's worse at night, and eye discharge. After waking up yesterday morning he couldn't see anything and notes eye discharge when he awakes as well as a white eye discharge during the day/at night. He's used OTC eye drops from [employer] without relief to his sxs. Pt does not wear contacts or glasses. He denies getting dust into his eyes.  Pt works at a nursing home.  Patient Active Problem List   Diagnosis Date Noted  . Diabetes mellitus without complication ([place]) 37/90/2409  . HTN (hypertension) 07/25/2012  . Mixed hyperlipidemia 07/25/2012   Past Medical History:  Diagnosis Date  . Diabetes mellitus without complication ([place])   . Hyperlipidemia   . Hypertension    History reviewed. No pertinent surgical history. Allergies  Allergen Reactions  . Wasp Venom Anaphylaxis  . Chloroquine Itching   Prior to Admission medications   Medication Sig Start Date End Date Taking? Authorizing Provider  aspirin 81 MG EC tablet Take 81 mg by mouth daily. Swallow whole.   Yes Historical Provider, MD  glimepiride (AMARYL) 4 MG tablet Take 1 tablet (4 mg) by mouth daily with breakfast 07/22/15  Yes Darlyne Russian, MD  losartan (COZAAR) 50 MG tablet TAKE 1 TABLET BY MOUTH DAILY 07/12/15  Yes Darlyne Russian, MD  losartan (COZAAR) 50 MG tablet Take 1 tablet (50 mg total) by mouth daily. 07/22/15  Yes Darlyne Russian, MD   metFORMIN (GLUCOPHAGE) 1000 MG tablet Take 1 tablet (1,000 mg total) by mouth daily. PATIENT NEEDS AN OFFICE VISIT FOR ADDITIONAL REFILLS. 06/13/15  Yes Robyn Haber, MD  pravastatin (PRAVACHOL) 40 MG tablet Take 1 tablet (40 mg total) by mouth daily. 07/22/15  Yes Darlyne Russian, MD  glipiZIDE (GLUCOTROL XL) 10 MG 24 hr tablet TAKE 1 TABLET BY MOUTH DAILY. Patient not taking: Reported on 06/26/2016 07/22/15   Darlyne Russian, MD  glucose monitoring kit (FREESTYLE) monitoring kit 1 each by Does not apply route as needed for other. 07/25/12 07/25/13  Posey Boyer, MD  metFORMIN (GLUCOPHAGE) 1000 MG tablet Take 1 tablet (1,000 mg total) by mouth 2 (two) times daily with a meal. Patient not taking: Reported on 06/26/2016 07/22/15   Darlyne Russian, MD  metoprolol succinate (TOPROL-XL) 50 MG 24 hr tablet Take 1 tablet (50 mg total) by mouth daily. NO MORE REFILLS WITHOUT OFFICE VISIT - 2ND NOTICE Patient not taking: Reported on 06/26/2016 10/15/14   Mancel Bale, PA-C  metoprolol succinate (TOPROL-XL) 50 MG 24 hr tablet Take 1 tablet (50 mg total) by mouth daily. Take with or immediately following a meal. Patient not taking: Reported on 06/26/2016 12/23/14   Roselee Culver, MD  sitaGLIPtin (JANUVIA) 100 MG tablet Take 1 tablet (100 mg total) by mouth daily. Patient not taking: Reported on 06/26/2016 02/22/14   Shawnee Knapp, MD   Social History   Social  History  . Marital status: Single    Spouse name: N/A  . Number of children: N/A  . Years of education: N/A   Occupational History  . Not on file.   Social History Main Topics  . Smoking status: Never Smoker  . Smokeless tobacco: Never Used  . Alcohol use 1.2 oz/week    2 Cans of beer per week  . Drug use: No  . Sexual activity: No   Other Topics Concern  . Not on file   Social History Narrative  . No narrative on file   Review of Systems  Eyes: Positive for photophobia, discharge and redness.  Neurological: Positive for headaches.    Objective:  Physical Exam  Constitutional: He appears well-developed and well-nourished. No distress.  HENT:  Head: Normocephalic and atraumatic.  Eyes: Right eye exhibits no exudate (at canti). Left eye exhibits no exudate (at canti). Right conjunctiva is injected (Diffuse sclera).  Anterior chamber was clear.  Neck: Neck supple.  Cardiovascular: Normal rate.   Pulmonary/Chest: Effort normal.  Lymphadenopathy:       Head (right side): No preauricular adenopathy present.       Head (left side): No preauricular adenopathy present.  Neurological: He is alert.  Skin: Skin is warm and dry.  Psychiatric: He has a normal mood and affect. His behavior is normal.  Nursing note and vitals reviewed.  BP (!) 155/80 (BP Location: Right Arm, Patient Position: Sitting, Cuff Size: Small)   Pulse 84   Temp 98.5 F (36.9 C) (Oral)   Resp 18   Ht 6' (1.829 m)   Wt 215 lb (97.5 kg)   SpO2 98%   BMI 29.16 kg/m    Visual Acuity Screening   Right eye Left eye Both eyes  Without correction: 20/50 20/50 20/50   With correction:      Assessment & Plan:    Jackson Mahoney is a 51 y.o. male Acute right eye pain - Plan: ciprofloxacin (CILOXAN) 0.3 % ophthalmic solution  Acute conjunctivitis of right eye, unspecified acute conjunctivitis type - Plan: ciprofloxacin (CILOXAN) 0.3 % ophthalmic solution  - Conjunctivitis versus iritis with some light sensitivity. Overall visual acuity equal to unaffected side. Start with ciprofloxacin drops every 2 hours for 2 days then every 4 hours, but if not significant improvement in the next 48 hours, recommended ophthalmology evaluation.  Meds ordered this encounter  Medications  . ciprofloxacin (CILOXAN) 0.3 % ophthalmic solution    Sig: Administer 1 drop to right eye every 2 hours, while awake, for 2 days. Then 1 drop, every 4 hours, while awake, for the next 5 days.    Dispense:  5 mL    Refill:  0   Patient Instructions    Start antibiotic drops to your  right eye today. 1 drop every 2 hours while awake for the next 2 days, then change to 1 drop every 4 hours while awake. If the discomfort or light sensitivity has not improved by Thursday, I would like you to meet with an eye specialist and we can refer you, as iritis (other type of eye infection or inflammation) is possible.  Please let us know if you are not improving. Return to the clinic or go to the nearest emergency room if any of your symptoms worsen or new symptoms occur.    Bacterial Conjunctivitis Bacterial conjunctivitis is an infection of the clear membrane that covers the white part of your eye and the inner surface of your eyelid (conjunctiva). When the  blood vessels in your conjunctiva become inflamed, your eye becomes red or pink, and it will probably feel itchy. Bacterial conjunctivitis spreads very easily from person to person (is contagious). It also spreads easily from one eye to the other eye. What are the causes? This condition is caused by several common bacteria. You may get the infection if you come into close contact with another person who is infected. You may also come into contact with items that are contaminated with the bacteria, such as a face towel, contact lens solution, or eye makeup. What increases the risk? This condition is more likely to develop in people who:  Are exposed to other people who have the infection.  Wear contact lenses.  Have a sinus infection.  Have had a recent eye injury or surgery.  Have a weak body defense system (immune system).  Have a medical condition that causes dry eyes. What are the signs or symptoms? Symptoms of this condition include:  Eye redness.  Tearing or watery eyes.  Itchy eyes.  Burning feeling in your eyes.  Thick, yellowish discharge from an eye. This may turn into a crust on the eyelid overnight and cause your eyelids to stick together.  Swollen eyelids.  Blurred vision. How is this diagnosed? Your  health care provider can diagnose this condition based on your symptoms and medical history. Your health care provider may also take a sample of discharge from your eye to find the cause of your infection. This is rarely done. How is this treated? Treatment for this condition includes:  Antibiotic eye drops or ointment to clear the infection more quickly and prevent the spread of infection to others.  Oral antibiotic medicines to treat infections that do not respond to drops or ointments, or last longer than 10 days.  Cool, wet cloths (cool compresses) placed on the eyes.  Artificial tears applied 2-6 times a day. Follow these instructions at home: Medicines  Take or apply your antibiotic medicine as told by your health care provider. Do not stop taking or applying the antibiotic even if you start to feel better.  Take or apply over-the-counter and prescription medicines only as told by your health care provider.  Be very careful to avoid touching the edge of your eyelid with the eye drop bottle or the ointment tube when you apply medicines to the affected eye. This will keep you from spreading the infection to your other eye or to other people. Managing discomfort  Gently wipe away any drainage from your eye with a warm, wet washcloth or a cotton ball.  Apply a cool, clean washcloth to your eye for 10-20 minutes, 3-4 times a day. General instructions  Do not wear contact lenses until the inflammation is gone and your health care provider says it is safe to wear them again. Ask your health care provider how to sterilize or replace your contact lenses before you use them again. Wear glasses until you can resume wearing contacts.  Avoid wearing eye makeup until the inflammation is gone. Throw away any old eye cosmetics that may be contaminated.  Change or wash your pillowcase every day.  Do not share towels or washcloths. This may spread the infection.  Wash your hands often with soap  and water. Use paper towels to dry your hands.  Avoid touching or rubbing your eyes.  Do not drive or use heavy machinery if your vision is blurred. Contact a health care provider if:  You have a fever.  Your  symptoms do not get better after 10 days. Get help right away if:  You have a fever and your symptoms suddenly get worse.  You have severe pain when you move your eye.  You have facial pain, redness, or swelling.  You have sudden loss of vision. This information is not intended to replace advice given to you by your health care provider. Make sure you discuss any questions you have with your health care provider. Document Released: 05/28/2005 Document Revised: 10/06/2015 Document Reviewed: 03/10/2015 Elsevier Interactive Patient Education  2017 [employer].   IF you received an x-ray today, you will receive an invoice from [facility]. Please contact [facility] at [phone] with questions or concerns regarding your invoice.   IF you received labwork today, you will receive an invoice from [place]. Please contact [facility] at [phone] with questions or concerns regarding your invoice.   Our billing staff will not be able to assist you with questions regarding bills from these companies.  You will be contacted with the lab results as soon as they are available. The fastest way to get your results is to activate your My Chart account. Instructions are located on the last page of this paperwork. If you have not heard from [place] regarding the results in 2 weeks, please contact this office.      I personally performed the services described in this documentation, which was scribed in my presence. The recorded information has been reviewed and considered, and addended by me as needed.   Signed,   Merri Ray, MD [facility] [place].  06/28/16 4:31 PM

## 2016-06-26 NOTE — Patient Instructions (Addendum)
Start antibiotic drops to your right eye today. 1 drop every 2 hours while awake for the next 2 days, then change to 1 drop every 4 hours while awake. If the discomfort or light sensitivity has not improved by Thursday, I would like you to meet with an eye specialist and we can refer you, as iritis (other type of eye infection or inflammation) is possible.  Please let us know if you are not improving. Return to the clinic or go to the nearest emergency room if any of your symptoms worsen or new symptoms occur.    Bacterial Conjunctivitis Bacterial conjunctivitis is an infection of the clear membrane that covers the white part of your eye and the inner surface of your eyelid (conjunctiva). When the blood vessels in your conjunctiva become inflamed, your eye becomes red or pink, and it will probably feel itchy. Bacterial conjunctivitis spreads very easily from person to person (is contagious). It also spreads easily from one eye to the other eye. What are the causes? This condition is caused by several common bacteria. You may get the infection if you come into close contact with another person who is infected. You may also come into contact with items that are contaminated with the bacteria, such as a face towel, contact lens solution, or eye makeup. What increases the risk? This condition is more likely to develop in people who:  Are exposed to other people who have the infection.  Wear contact lenses.  Have a sinus infection.  Have had a recent eye injury or surgery.  Have a weak body defense system (immune system).  Have a medical condition that causes dry eyes. What are the signs or symptoms? Symptoms of this condition include:  Eye redness.  Tearing or watery eyes.  Itchy eyes.  Burning feeling in your eyes.  Thick, yellowish discharge from an eye. This may turn into a crust on the eyelid overnight and cause your eyelids to stick together.  Swollen eyelids.  Blurred  vision. How is this diagnosed? Your health care provider can diagnose this condition based on your symptoms and medical history. Your health care provider may also take a sample of discharge from your eye to find the cause of your infection. This is rarely done. How is this treated? Treatment for this condition includes:  Antibiotic eye drops or ointment to clear the infection more quickly and prevent the spread of infection to others.  Oral antibiotic medicines to treat infections that do not respond to drops or ointments, or last longer than 10 days.  Cool, wet cloths (cool compresses) placed on the eyes.  Artificial tears applied 2-6 times a day. Follow these instructions at home: Medicines  Take or apply your antibiotic medicine as told by your health care provider. Do not stop taking or applying the antibiotic even if you start to feel better.  Take or apply over-the-counter and prescription medicines only as told by your health care provider.  Be very careful to avoid touching the edge of your eyelid with the eye drop bottle or the ointment tube when you apply medicines to the affected eye. This will keep you from spreading the infection to your other eye or to other people. Managing discomfort  Gently wipe away any drainage from your eye with a warm, wet washcloth or a cotton ball.  Apply a cool, clean washcloth to your eye for 10-20 minutes, 3-4 times a day. General instructions  Do not wear contact lenses until the inflammation is  gone and your health care provider says it is safe to wear them again. Ask your health care provider how to sterilize or replace your contact lenses before you use them again. Wear glasses until you can resume wearing contacts.  Avoid wearing eye makeup until the inflammation is gone. Throw away any old eye cosmetics that may be contaminated.  Change or wash your pillowcase every day.  Do not share towels or washcloths. This may spread the  infection.  Wash your hands often with soap and water. Use paper towels to dry your hands.  Avoid touching or rubbing your eyes.  Do not drive or use heavy machinery if your vision is blurred. Contact a health care provider if:  You have a fever.  Your symptoms do not get better after 10 days. Get help right away if:  You have a fever and your symptoms suddenly get worse.  You have severe pain when you move your eye.  You have facial pain, redness, or swelling.  You have sudden loss of vision. This information is not intended to replace advice given to you by your health care provider. Make sure you discuss any questions you have with your health care provider. Document Released: 05/28/2005 Document Revised: 10/06/2015 Document Reviewed: 03/10/2015 Elsevier Interactive Patient Education  2017 [employer].   IF you received an x-ray today, you will receive an invoice from [facility]. Please contact [facility] at [phone] with questions or concerns regarding your invoice.   IF you received labwork today, you will receive an invoice from [place]. Please contact [facility] at [phone] with questions or concerns regarding your invoice.   Our billing staff will not be able to assist you with questions regarding bills from these companies.  You will be contacted with the lab results as soon as they are available. The fastest way to get your results is to activate your My Chart account. Instructions are located on the last page of this paperwork. If you have not heard from [place] regarding the results in 2 weeks, please contact this office.

## 2016-07-27 ENCOUNTER — Other Ambulatory Visit: Payer: Self-pay | Admitting: Emergency Medicine

## 2016-08-06 ENCOUNTER — Telehealth: Payer: Self-pay | Admitting: *Deleted

## 2016-08-06 ENCOUNTER — Ambulatory Visit (INDEPENDENT_AMBULATORY_CARE_PROVIDER_SITE_OTHER): Payer: BLUE CROSS/BLUE SHIELD | Admitting: Family Medicine

## 2016-08-06 ENCOUNTER — Telehealth: Payer: Self-pay | Admitting: Family Medicine

## 2016-08-06 VITALS — BP 180/94 | HR 100 | Temp 98.1°F | Resp 18 | Ht 72.0 in | Wt 213.2 lb

## 2016-08-06 DIAGNOSIS — I10 Essential (primary) hypertension: Secondary | ICD-10-CM

## 2016-08-06 DIAGNOSIS — E1165 Type 2 diabetes mellitus with hyperglycemia: Secondary | ICD-10-CM

## 2016-08-06 DIAGNOSIS — R358 Other polyuria: Secondary | ICD-10-CM

## 2016-08-06 DIAGNOSIS — R809 Proteinuria, unspecified: Secondary | ICD-10-CM

## 2016-08-06 DIAGNOSIS — E119 Type 2 diabetes mellitus without complications: Secondary | ICD-10-CM | POA: Diagnosis not present

## 2016-08-06 DIAGNOSIS — IMO0001 Reserved for inherently not codable concepts without codable children: Secondary | ICD-10-CM

## 2016-08-06 DIAGNOSIS — N182 Chronic kidney disease, stage 2 (mild): Secondary | ICD-10-CM

## 2016-08-06 DIAGNOSIS — R3589 Other polyuria: Secondary | ICD-10-CM

## 2016-08-06 DIAGNOSIS — E1129 Type 2 diabetes mellitus with other diabetic kidney complication: Secondary | ICD-10-CM

## 2016-08-06 LAB — POCT URINALYSIS DIP (MANUAL ENTRY)
Bilirubin, UA: NEGATIVE
Glucose, UA: 500 — AB
Ketones, POC UA: NEGATIVE
Leukocytes, UA: NEGATIVE
Nitrite, UA: NEGATIVE
Protein Ur, POC: >=300 — AB
Spec Grav, UA: 1.015
Urobilinogen, UA: 0.2
pH, UA: 5.5

## 2016-08-06 LAB — POC MICROSCOPIC URINALYSIS (UMFC): Mucus: ABSENT

## 2016-08-06 LAB — POCT GLYCOSYLATED HEMOGLOBIN (HGB A1C): Hemoglobin A1C: 14

## 2016-08-06 MED ORDER — GLIPIZIDE ER 10 MG PO TB24: ORAL_TABLET | ORAL | 3 refills | Status: DC

## 2016-08-06 MED ORDER — INSULIN GLARGINE 100 UNIT/ML SOLOSTAR PEN: 20.0000 [IU] | PEN_INJECTOR | Freq: Every day | SUBCUTANEOUS | 6 refills | Status: DC

## 2016-08-06 MED ORDER — LISINOPRIL 20 MG PO TABS
20.0000 mg | ORAL_TABLET | Freq: Every day | ORAL | 3 refills | Status: DC
Start: 2016-08-06 — End: 2016-08-06

## 2016-08-06 MED ORDER — ATORVASTATIN CALCIUM 40 MG PO TABS: 40.0000 mg | ORAL_TABLET | Freq: Every day | ORAL | 3 refills | Status: DC

## 2016-08-06 MED ORDER — LISINOPRIL 40 MG PO TABS: 40.0000 mg | ORAL_TABLET | Freq: Every day | ORAL | 3 refills | Status: DC

## 2016-08-06 MED ORDER — INSULIN PEN NEEDLE 32G X 4 MM MISC: 11 refills | Status: DC

## 2016-08-06 NOTE — Patient Instructions (Addendum)
IF you received an x-ray today, you will receive an invoice from [facility]. Please contact [facility] at [phone] with questions or concerns regarding your invoice.   IF you received labwork today, you will receive an invoice from [facility]. Please contact [facility] at [phone] with questions or concerns regarding your invoice.   Our billing staff will not be able to assist you with questions regarding bills from these companies.  You will be contacted with the lab results as soon as they are available. The fastest way to get your results is to activate your My Chart account. Instructions are located on the last page of this paperwork. If you have not heard from [place] regarding the results in 2 weeks, please contact this office.    Low Glycemic Foods (20-49)  Breakfast Cereals: All-Bran                All-Bran Fruit ' n Oats Fiber One               Oatmeal (not instant)  Oat bran Fruits and fruit juices: (Limit to 1-2 servings per day) Apples               Apricots (fresh & dried)  Blackberries            Blueberries Cherries                  Cranberries             Peaches                  Pears                       Plums                       Prunes Grapefruit                Raspberries            Strawberries           Tangerine Apple juice             Grapefruit juice Tomato juice  Beans and legumes (fresh-cooked): Black-eyed peas     Butter beans Chick peas              Lentils     Green beans           Lima beans               Kidney beans          Navy beans  Non-starchy vegetables: Asparagus, bok choy, broccoli, cabbage, cauliflower, celery, cucumber, greens, lettuce, mushrooms, peppers, tomatoes, okra, onions, snow peas, spinach, summer squash  Grains: Barley                                Bulgur Rye                                    Wild rice  Nuts and oils : Almonds         Peanuts     Sunflower seeds  Hazelnuts      Pecans           Walnuts Oils that are liquid at room temperature  Dairy, fish, and meat:  Milk, skim                         Lowfat cheese Yogurt, lowfat, fruit sugar sweetened Lean red meat                      Fish  Skinless chicken & [place]    Shellfish Moderate Glycemic Foods (50-69)  Breakfast Cereals: Bran Buds                             Bran Chex Just Right                            Mini-Wheats Special K         Swiss muesli  Fruits: Banana (under-ripe)             Dates Figs                                      Grapes Kiwi                                      Mango Oranges                               Raisins  Fruit Juices: Cranberry juice                    Orange juice  Beans and legumes: [place]-type baked beans Canned pinto, kidney, or navy beans Green peas  Vegetables: Beets                         Raw Carrots  Sweet potato              Yam Corn on the cob  Breads: Pita (pocket) bread          Oat bran bread Pumpernickel bread           Rye bread Wheat bread, high fiber       Grains: Cornmeal                           Rice, brown   Rice, white                         Couscous Pasta: Macaroni                           Pizza  cheese Raviolimeat filled           Spaghetti, white        Nuts: Cashews                           Macadamia  Snacks: Chocolate                    Ice cream,lowfat  Muffin  Popcorn High Glycemic Foods (70-100)   Breakfast Cereals: Cheerios                 Corn Chex Corn Flakes            Cream of Wheat Grape Nuts              Grape Nut Flakes Life                 Nutri-Grain       Puffed Rice               Puffed Wheat Rice Chex                 Rice Krispies Shredded Wheat             Team Total Fruits: Pineapple                 Watermelon Banana (over-ripe)  Beverages: Sodas, sweet tea, pineapple juice  Vegetables: Potato, baked, boiled, fried, mashed JamaicaFrench fries Canned or frozen  corn Cooked carrots Parsnips Winter squash  Breads: Most breads (white and whole grain) Bagels                     Bread sticks Bread stuffing          Kaiser roll Dinner rolls  Grains: Rice, instant          Tapioca, with milk  Candy and most cookies Snacks: Donuts                      Corn chips        Jelly beans                 Pretzels Pastries                             Restaurant and ethnic foods Most Congohinese food (sugar in stir fry or wok sauces) Teriyaki-style meats and vegetables    Blood Glucose Monitoring, Adult Monitoring your blood sugar (glucose) helps you manage your diabetes. It also helps you and your health care provider determine how well your diabetes management plan is working. Blood glucose monitoring involves checking your blood glucose as often as directed, and keeping a record (log) of your results over time. Why should I monitor my blood glucose? Checking your blood glucose regularly can:  Help you understand how food, exercise, illnesses, and medicines affect your blood glucose.  Let you know what your blood glucose is at any time. You can quickly tell if you are having low blood glucose (hypoglycemia) or high blood glucose (hyperglycemia).  Help you and your health care provider adjust your medicines as needed. When should I check my blood glucose? Follow instructions from your health care provider about how often to check your blood glucose. This may depend on:  The type of diabetes you have.  How well-controlled your diabetes is.  Medicines you are taking. If you have type 1 diabetes:  Check your blood glucose at least 2 times a day.  Also check your blood glucose:  Before every insulin injection.  Before and after exercise.  Between meals.  2 hours after a meal.  Occasionally between 2:00 a.m. and 3:00 a.m., as directed.  Before potentially dangerous tasks, like driving or using heavy machinery.  At bedtime.  You may  need to check your blood glucose more often,  up to 6-10 times a day:  If you use an insulin pump.  If you need multiple daily injections (MDI).  If your diabetes is not well-controlled.  If you are ill.  If you have a history of severe hypoglycemia.  If you have a history of not knowing when your blood glucose is getting low (hypoglycemia unawareness). If you have type 2 diabetes:  If you take insulin or other diabetes medicines, check your blood glucose at least 2 times a day.  If you are on intensive insulin therapy, check your blood glucose at least 4 times a day. Occasionally, you may also need to check between 2:00 a.m. and 3:00 a.m., as directed.  Also check your blood glucose:  Before and after exercise.  Before potentially dangerous tasks, like driving or using heavy machinery.  You may need to check your blood glucose more often if:  Your medicine is being adjusted.  Your diabetes is not well-controlled.  You are ill. What is a blood glucose log?  A blood glucose log is a record of your blood glucose readings. It helps you and your health care provider:  Look for patterns in your blood glucose over time.  Adjust your diabetes management plan as needed.  Every time you check your blood glucose, write down your result and notes about things that may be affecting your blood glucose, such as your diet and exercise for the day.  Most glucose meters store a record of glucose readings in the meter. Some meters allow you to download your records to a computer. How do I check my blood glucose? Follow these steps to get accurate readings of your blood glucose: Supplies needed   Blood glucose meter.  Test strips for your meter. Each meter has its own strips. You must use the strips that come with your meter.  A needle to prick your finger (lancet). Do not use lancets more than once.  A device that holds the lancet (lancing device).  A journal or log book to write  down your results. Procedure  Wash your hands with soap and water.  Prick the side of your finger (not the tip) with the lancet. Use a different finger each time.  Gently rub the finger until a small drop of blood appears.  Follow instructions that come with your meter for inserting the test strip, applying blood to the strip, and using your blood glucose meter.  Write down your result and any notes. Alternative testing sites  Some meters allow you to use areas of your body other than your finger (alternative sites) to test your blood.  If you think you may have hypoglycemia, or if you have hypoglycemia unawareness, do not use alternative sites. Use your finger instead.  Alternative sites may not be as accurate as the fingers, because blood flow is slower in these areas. This means that the result you get may be delayed, and it may be different from the result that you would get from your finger.  The most common alternative sites are:  Forearm.  Thigh.  Palm of the hand. Additional tips  Always keep your supplies with you.  If you have questions or need help, all blood glucose meters have a 24-hour "hotline" number that you can call. You may also contact your health care provider.  After you use a few boxes of test strips, adjust (calibrate) your blood glucose meter by following instructions that came with your meter. This information is not intended to replace  advice given to you by your health care provider. Make sure you discuss any questions you have with your health care provider. Document Released: 05/31/2003 Document Revised: 12/16/2015 Document Reviewed: 11/07/2015 Elsevier Interactive Patient Education  2017 [employer].

## 2016-08-06 NOTE — Telephone Encounter (Signed)
Spoke with patient transferred to front to schedule a follow up appointment.   Advised patient he needs an appointment for any additional refills.  Patient voiced understanding

## 2016-08-06 NOTE — Progress Notes (Signed)
Chief Complaint  Patient presents with  . Medication Refill    diabetes    HPI    Hypertension: Patient here for follow-up of elevated blood pressure. He is not exercising and is not adherent to low salt diet.  Blood pressure is not well controlled at home. He is not taking his blood pressure medications. He said he just stopped because of how he was feeling weak.  BP Readings from Last 3 Encounters:  08/06/16 (!) 180/94  06/26/16 (!) 155/80  07/08/15 124/72     Lab Results  Component Value Date   HGBA1C 14.0 08/06/2016    Diabetes Mellitus: Patient presents for follow up of diabetes. He stopped his diabetes medications.  he had nausea and upset stomach on metformin. Symptoms: hyperglycemia, polydipsia and polyuria. Symptoms have gradually worsened. Patient denies hypoglycemia , increase appetite and paresthesia of the feet.  Evaluation to date has been included: hemoglobin A1C.  Home sugars: patient does not check sugars. Treatment to date: pt noncompliant with all meds.  Lab Results  Component Value Date   HGBA1C 14.0 08/06/2016    He eats rice every day and in large quantities.     Past Medical History:  Diagnosis Date  . Diabetes mellitus without complication ([place])   . Hyperlipidemia   . Hypertension     Current Outpatient Prescriptions  Medication Sig Dispense Refill  . aspirin 81 MG EC tablet Take 81 mg by mouth daily. Swallow whole.    Marland Kitchen glipiZIDE (GLUCOTROL XL) 10 MG 24 hr tablet TAKE 1 TABLET BY MOUTH DAILY. 90 tablet 3  . atorvastatin (LIPITOR) 40 MG tablet Take 1 tablet (40 mg total) by mouth daily. 90 tablet 3  . glucose monitoring kit (FREESTYLE) monitoring kit 1 each by Does not apply route as needed for other. 1 each 0  . Insulin Glargine (LANTUS) 100 UNIT/ML Solostar Pen Inject 20 Units into the skin daily at 10 pm. 30 mL 6  . Insulin Pen Needle (BD PEN NEEDLE NANO U/F) 32G X 4 MM MISC Use with insulin pen every evening 100 each 11  . lisinopril  (PRINIVIL,ZESTRIL) 40 MG tablet Take 1 tablet (40 mg total) by mouth daily. 90 tablet 3   No current facility-administered medications for this visit.     Allergies:  Allergies  Allergen Reactions  . Wasp Venom Anaphylaxis  . Chloroquine Itching    No past surgical history on file.  Social History   Social History  . Marital status: Single    Spouse name: N/A  . Number of children: N/A  . Years of education: N/A   Social History Main Topics  . Smoking status: Never Smoker  . Smokeless tobacco: Never Used  . Alcohol use 1.2 oz/week    2 Cans of beer per week  . Drug use: No  . Sexual activity: No   Other Topics Concern  . None   Social History Narrative  . None    Review of Systems  Constitutional: Negative for chills, fever and weight loss.  Eyes: Negative for blurred vision and double vision.  Cardiovascular: Negative for chest pain, palpitations and leg swelling.  Gastrointestinal: Negative for abdominal pain, blood in stool, constipation, diarrhea, heartburn, melena, nausea and vomiting.  Genitourinary: Positive for frequency. Negative for dysuria and urgency.  Skin: Negative for itching and rash.  Neurological: Negative for dizziness, tingling and headaches.  Endo/Heme/Allergies: Positive for polydipsia.    Objective: Vitals:   08/06/16 1417  BP: (!) 180/94  Pulse:  100  Resp: 18  Temp: 98.1 F (36.7 C)  TempSrc: Oral  SpO2: 98%  Weight: 213 lb 3.2 oz (96.7 kg)  Height: 6' (1.829 m)   Diabetic Foot Exam - Simple   Simple Foot Form Diabetic Foot exam was performed with the following findings:  Yes 08/06/2016  3:57 PM  Visual Inspection No deformities, no ulcerations, no other skin breakdown bilaterally:  Yes Sensation Testing Intact to touch and monofilament testing bilaterally:  Yes Pulse Check Posterior Tibialis and Dorsalis pulse intact bilaterally:  Yes Comments     Physical Exam  Constitutional: He is oriented to person, place, and  time. He appears well-developed and well-nourished.  HENT:  Head: Normocephalic and atraumatic.  Eyes: Conjunctivae and EOM are normal. Pupils are equal, round, and reactive to light.  Cardiovascular: Normal rate, regular rhythm and normal heart sounds.   No murmur heard. Pulses:      Dorsalis pedis pulses are 2+ on the right side, and 2+ on the left side.       Posterior tibial pulses are 2+ on the right side, and 2+ on the left side.  Pulmonary/Chest: Effort normal and breath sounds normal. No respiratory distress. He has no wheezes. He has no rales.  Abdominal: Soft. Bowel sounds are normal. He exhibits no distension. There is no tenderness. There is no guarding.  Musculoskeletal:       Right foot: There is normal range of motion and no deformity.       Left foot: There is normal range of motion and no deformity.  Feet:  Right Foot:  Protective Sensation: 4 sites tested. 4 sites sensed.  Left Foot:  Protective Sensation: 4 sites tested. 4 sites sensed.  Neurological: He is alert and oriented to person, place, and time.  Skin: Skin is warm. Capillary refill takes less than 2 seconds. No erythema.  Psychiatric: He has a normal mood and affect. His behavior is normal. Judgment and thought content normal.    Assessment and Plan Jackson Mahoney was seen today for medication refill.  Diagnoses and all orders for this visit:  Diabetes mellitus without complication ([place]) -     POCT glycosylated hemoglobin (Hb A1C) -     Microalbumin, urine -     Lipid panel -     Comprehensive metabolic panel  Essential hypertension, benign  Uncontrolled hypertension- changed bp meds to lisinopril 19m -     Microalbumin, urine -     Lipid panel  Polyuria- no uti, likely due to uncontrolled diabetes -     POCT urinalysis dipstick -     POCT Microscopic Urinalysis ([facility])  Uncontrolled type 2 diabetes mellitus without complication, without long-term current use of insulin ([place]- discussed that he needs  to be on metformin and glipizide -     Ambulatory referral to diabetic education  Other orders -     Discontinue: lisinopril (PRINIVIL,ZESTRIL) 20 MG tablet; Take 1 tablet (20 mg total) by mouth daily. -     atorvastatin (LIPITOR) 40 MG tablet; Take 1 tablet (40 mg total) by mouth daily. -     Insulin Glargine (LANTUS) 100 UNIT/ML Solostar Pen; Inject 20 Units into the skin daily at 10 pm. -     glipiZIDE (GLUCOTROL XL) 10 MG 24 hr tablet; TAKE 1 TABLET BY MOUTH DAILY. -     lisinopril (PRINIVIL,ZESTRIL) 40 MG tablet; Take 1 tablet (40 mg total) by mouth daily. -     Insulin Pen Needle (BD PEN NEEDLE NANO  U/F) 32G X 4 MM MISC; Use with insulin pen every evening  A total of 40 minutes were spent face-to-face with the patient during this encounter and over half of that time was spent on counseling and coordination of care.  Spent time going over meal options and healthy substitions    Zoe A Stallings

## 2016-08-06 NOTE — Telephone Encounter (Signed)
Pt saw Dr Creta LevinStallings he's calling stating that the lantus cost to much and would like to go back to Metformin but if she could give him something else much cheaper for a week or two please call into [facility] [address]

## 2016-08-07 LAB — COMPREHENSIVE METABOLIC PANEL
ALT: 11 IU/L (ref 0–44)
AST: 16 IU/L (ref 0–40)
Albumin/Globulin Ratio: 1.3 (ref 1.2–2.2)
Albumin: 3.9 g/dL (ref 3.5–5.5)
Alkaline Phosphatase: 98 IU/L (ref 39–117)
BUN/Creatinine Ratio: 13 (ref 9–20)
BUN: 19 mg/dL (ref 6–24)
Bilirubin Total: <0.2 mg/dL (ref 0.0–1.2)
CO2: 23 mmol/L (ref 18–29)
Calcium: 9.4 mg/dL (ref 8.7–10.2)
Chloride: 95 mmol/L — ABNORMAL LOW (ref 96–106)
Creatinine, Ser: 1.42 mg/dL — ABNORMAL HIGH (ref 0.76–1.27)
GFR calc Af Amer: 66 mL/min/{1.73_m2} (ref >59)
GFR calc non Af Amer: 57 mL/min/{1.73_m2} — ABNORMAL LOW (ref >59)
Globulin, Total: 3.1 g/dL (ref 1.5–4.5)
Glucose: 487 mg/dL — ABNORMAL HIGH (ref 65–99)
Potassium: 5.6 mmol/L — ABNORMAL HIGH (ref 3.5–5.2)
Sodium: 132 mmol/L — ABNORMAL LOW (ref 134–144)
Total Protein: 7 g/dL (ref 6.0–8.5)

## 2016-08-07 LAB — LIPID PANEL
Chol/HDL Ratio: 5.4 ratio units — ABNORMAL HIGH (ref 0.0–5.0)
Cholesterol, Total: 245 mg/dL — ABNORMAL HIGH (ref 100–199)
HDL: 45 mg/dL (ref >39)
LDL Calculated: 177 mg/dL — ABNORMAL HIGH (ref 0–99)
Triglycerides: 117 mg/dL (ref 0–149)
VLDL Cholesterol Cal: 23 mg/dL (ref 5–40)

## 2016-08-07 LAB — MICROALBUMIN, URINE: Microalbumin, Urine: 768.2 ug/mL

## 2016-08-07 NOTE — Telephone Encounter (Signed)
Please let him know that his insurance has a list of their preferred insulin.  If he could call the insurance they will tell him which one they cover. Insulin is the best option.  There are 4 or 5 different kinds and every January the insurance companies pick a type of insulin that they prefer.  Once I find out which they prefer then I will change it to the correct insulin.

## 2016-08-08 ENCOUNTER — Telehealth: Payer: Self-pay

## 2016-08-08 DIAGNOSIS — R809 Proteinuria, unspecified: Secondary | ICD-10-CM

## 2016-08-08 DIAGNOSIS — N182 Chronic kidney disease, stage 2 (mild): Secondary | ICD-10-CM | POA: Insufficient documentation

## 2016-08-08 DIAGNOSIS — E1165 Type 2 diabetes mellitus with hyperglycemia: Secondary | ICD-10-CM | POA: Insufficient documentation

## 2016-08-08 DIAGNOSIS — IMO0002 Reserved for concepts with insufficient information to code with codable children: Secondary | ICD-10-CM | POA: Insufficient documentation

## 2016-08-08 DIAGNOSIS — E1129 Type 2 diabetes mellitus with other diabetic kidney complication: Secondary | ICD-10-CM | POA: Insufficient documentation

## 2016-08-08 MED ORDER — "INSULIN SYRINGE-NEEDLE U-100 31G X 5/16"" 0.3 ML MISC": 3 refills | Status: DC

## 2016-08-08 MED ORDER — INSULIN GLARGINE 100 UNIT/ML ~~LOC~~ SOLN: 20.0000 [IU] | Freq: Every day | SUBCUTANEOUS | 3 refills | Status: DC

## 2016-08-08 NOTE — Telephone Encounter (Signed)
[facility]  Since he has a high deductible plan insulin lantus vials would be cheaper  Called pt to let him know that since his creatinine is high he will need insulin In the last office visit the patient received education on how to give himself insulin from youtube Reminded pt to rewatch videos  Also notified pt to follow up as discussed  Pt to call back if he has questions

## 2016-08-08 NOTE — Telephone Encounter (Signed)
Called pt to discuss labs.  Updated problem list with Chronic Renal Insufficiency.  Discussed that he needs insulin.  He should call his insurance to find out which insulin is on their formulary.  He will call insurance and call back.  Also placed referral for Endocrinology to help pt get his insulin covered.

## 2016-08-08 NOTE — Telephone Encounter (Signed)
Pt is calling to let stallings know that he called his insurance co and he qualifies for generic and tier one medications   Best number [phone]

## 2016-08-29 ENCOUNTER — Encounter: Payer: Self-pay | Admitting: Family Medicine

## 2016-08-29 ENCOUNTER — Ambulatory Visit (INDEPENDENT_AMBULATORY_CARE_PROVIDER_SITE_OTHER): Payer: BLUE CROSS/BLUE SHIELD | Admitting: Family Medicine

## 2016-08-29 VITALS — BP 177/85 | HR 90 | Temp 98.2°F | Resp 18 | Ht 72.0 in | Wt 218.0 lb

## 2016-08-29 DIAGNOSIS — Z1211 Encounter for screening for malignant neoplasm of colon: Secondary | ICD-10-CM

## 2016-08-29 DIAGNOSIS — Z23 Encounter for immunization: Secondary | ICD-10-CM

## 2016-08-29 DIAGNOSIS — I10 Essential (primary) hypertension: Secondary | ICD-10-CM | POA: Diagnosis not present

## 2016-08-29 DIAGNOSIS — E782 Mixed hyperlipidemia: Secondary | ICD-10-CM

## 2016-08-29 DIAGNOSIS — E1165 Type 2 diabetes mellitus with hyperglycemia: Secondary | ICD-10-CM | POA: Diagnosis not present

## 2016-08-29 MED ORDER — HYDROCHLOROTHIAZIDE 25 MG PO TABS: 25.0000 mg | ORAL_TABLET | Freq: Every day | ORAL | 6 refills | Status: DC

## 2016-08-29 NOTE — Progress Notes (Signed)
Chief Complaint  Patient presents with  . Follow-up  . Diabetes    patient states he has no issues with his diabetes or the medication    HPI  Hypertension: Patient here for follow-up of elevated blood pressure. He is exercising and is now adherent to low salt diet.  Blood pressure is not well controlled at home. Cardiac symptoms none. Patient denies chest pain, exertional chest pressure/discomfort, lower extremity edema and palpitations.  Cardiovascular risk factors: diabetes mellitus, hypertension, male gender and obesity (BMI >= 30 kg/m2). Use of agents associated with hypertension: none. History of target organ damage: none. BP Readings from Last 3 Encounters:  08/29/16 (!) 177/85  08/06/16 (!) 180/94  06/26/16 (!) 155/80    Diabetes Mellitus: Patient presents for follow up of diabetes. He reports that he is getting better readings now with blood glucose of 120s-130s fasting and after lunch He is eating more protein He gave up while rice and candy He does a glass of milk when the sugars for his low sugar episodes Lab Results  Component Value Date   HGBA1C 14.0 08/06/2016    Hyperlipidemia: Patient presents with hyperlipidemia.  He was tested because of routine screening.  He is eating less meat and is eating less fried foods.  He restarted his statin and expects that on the next check his levels will be better.  Lab Results  Component Value Date   CHOL 245 (H) 08/06/2016   CHOL 220 (H) 07/08/2015   CHOL 187 12/23/2014   Lab Results  Component Value Date   HDL 45 08/06/2016   HDL 55 07/08/2015   HDL 53 12/23/2014   Lab Results  Component Value Date   LDLCALC 177 (H) 08/06/2016   LDLCALC 148 (H) 07/08/2015   LDLCALC 119 (H) 12/23/2014   Lab Results  Component Value Date   TRIG 117 08/06/2016   TRIG 86 07/08/2015   TRIG 76 12/23/2014   Lab Results  Component Value Date   CHOLHDL 5.4 (H) 08/06/2016   CHOLHDL 4.0 07/08/2015   CHOLHDL 3.5 12/23/2014   No  results found for: LDLDIRECT   Past Medical History:  Diagnosis Date  . Diabetes mellitus without complication ([place])   . Hyperlipidemia   . Hypertension     Current Outpatient Prescriptions  Medication Sig Dispense Refill  . aspirin 81 MG EC tablet Take 81 mg by mouth daily. Swallow whole.    Marland Kitchen atorvastatin (LIPITOR) 40 MG tablet Take 1 tablet (40 mg total) by mouth daily. 90 tablet 3  . glipiZIDE (GLUCOTROL XL) 10 MG 24 hr tablet TAKE 1 TABLET BY MOUTH DAILY. 90 tablet 3  . insulin glargine (LANTUS) 100 UNIT/ML injection Inject 0.2 mLs (20 Units total) into the skin at bedtime. 10 mL 3  . Insulin Pen Needle (BD PEN NEEDLE NANO U/F) 32G X 4 MM MISC Use with insulin pen every evening 100 each 11  . Insulin Syringe-Needle U-100 (B-D INSULIN SYRINGE) 31G X 5/16" 0.3 ML MISC Use to administer insulin daily at bedtime 60 each 3  . lisinopril (PRINIVIL,ZESTRIL) 40 MG tablet Take 1 tablet (40 mg total) by mouth daily. 90 tablet 3  . glucose monitoring kit (FREESTYLE) monitoring kit 1 each by Does not apply route as needed for other. 1 each 0  . hydrochlorothiazide (HYDRODIURIL) 25 MG tablet Take 1 tablet (25 mg total) by mouth daily. 30 tablet 6   No current facility-administered medications for this visit.     Allergies:  Allergies  Allergen  Reactions  . Wasp Venom Anaphylaxis  . Chloroquine Itching    History reviewed. No pertinent surgical history.  Social History   Social History  . Marital status: Single    Spouse name: N/A  . Number of children: N/A  . Years of education: N/A   Social History Main Topics  . Smoking status: Never Smoker  . Smokeless tobacco: Never Used  . Alcohol use 1.2 oz/week    2 Cans of beer per week  . Drug use: No  . Sexual activity: No   Other Topics Concern  . None   Social History Narrative  . None    Review of Systems  Constitutional: Negative for chills and fever.  Eyes: Negative for blurred vision, double vision and photophobia.    Cardiovascular: Negative for chest pain and palpitations.  Gastrointestinal: Negative for abdominal pain, nausea and vomiting.  Skin: Negative for itching and rash.    Objective: Vitals:   08/29/16 1414  BP: (!) 177/85  Pulse: 90  Resp: 18  Temp: 98.2 F (36.8 C)  TempSrc: Oral  SpO2: 98%  Weight: 218 lb (98.9 kg)  Height: 6' (1.829 m)   Wt Readings from Last 3 Encounters:  08/29/16 218 lb (98.9 kg)  08/06/16 213 lb 3.2 oz (96.7 kg)  06/26/16 215 lb (97.5 kg)    Physical Exam  Constitutional: He appears well-developed and well-nourished.  HENT:  Head: Normocephalic and atraumatic.  Eyes: Conjunctivae and EOM are normal.  Neck: Normal range of motion. Neck supple.  Cardiovascular: Normal rate, regular rhythm and normal heart sounds.   Pulmonary/Chest: Effort normal and breath sounds normal. No respiratory distress. He has no wheezes.  Musculoskeletal: Normal range of motion. He exhibits no edema.    Assessment and Plan Quanah was seen today for follow-up and diabetes.  Diagnoses and all orders for this visit:  Uncontrolled hypertension- improving Since pt was just restarted on bp meds will add hctz in addition to lisinopril  -     hydrochlorothiazide (HYDRODIURIL) 25 MG tablet; Take 1 tablet (25 mg total) by mouth daily.  Uncontrolled type 2 diabetes mellitus with hyperglycemia, without long-term current use of insulin ([place])- based on home readings pt blood glucose improved overall Continue to monitor and if hypoglycemic then decrease bedtime insulin by 2 units to achieve fasting goal of 90-140   Screening for colon cancer- discussed the need for continued screening for other risk factors  Since he is 65 will refer for colonoscopy -     [facility] COLONOSCOPY  Mixed hyperlipidemia- continue statin Continue fiber rich diet   Need for prophylactic vaccination against Streptococcus pneumoniae (pneumococcus)- discussed risks and benefits Vaccine given to reduce risk in  this diabetic  Other orders -     Pneumococcal polysaccharide vaccine 23-valent greater than or equal to 2yo subcutaneous/IM     Draxton Luu A [employer]

## 2016-08-29 NOTE — Patient Instructions (Addendum)
IF you received an x-ray today, you will receive an invoice from [facility]. Please contact [facility] at [phone] with questions or concerns regarding your invoice.   IF you received labwork today, you will receive an invoice from [facility]. Please contact [facility] at [phone] with questions or concerns regarding your invoice.   Our billing staff will not be able to assist you with questions regarding bills from these companies.  You will be contacted with the lab results as soon as they are available. The fastest way to get your results is to activate your My Chart account. Instructions are located on the last page of this paperwork. If you have not heard from [place] regarding the results in 2 weeks, please contact this office.      Diabetes Mellitus and Standards of Medical Care Managing diabetes (diabetes mellitus) can be complicated. Your diabetes treatment may be managed by a team of health care providers, including:  A diet and nutrition specialist (registered dietitian).  A nurse.  A certified diabetes educator (CDE).  A diabetes specialist (endocrinologist).  An eye doctor.  A primary care provider.  A dentist. Your health care providers follow a schedule in order to help you get the best quality of care. The following schedule is a general guideline for your diabetes management plan. Your health care providers may also give you more specific instructions. HbA1c ( hemoglobin A1c) test This test provides information about blood sugar (glucose) control over the previous 2-3 months. It is used to check whether your diabetes management plan needs to be adjusted.  If you are meeting your treatment goals, this test is done at least 2 times a year.  If you are not meeting treatment goals or if your treatment goals have changed, this test is done 4 times a year. Blood pressure test  This test is done at every routine medical visit. For most people,  the goal is less than 130/80. Ask your health care provider what your goal blood pressure should be. Dental and eye exams  Visit your dentist two times a year.  If you have type 1 diabetes, get an eye exam 3-5 years after you are diagnosed, and then once a year after your first exam.  If you were diagnosed with type 1 diabetes as a child, get an eye exam when you are age 66 or older and have had diabetes for 3-5 years. After the first exam, you should get an eye exam once a year.  If you have type 2 diabetes, have an eye exam as soon as you are diagnosed, and then once a year after your first exam. Foot care exam  Visual foot exams are done at every routine medical visit. The exams check for cuts, bruises, redness, blisters, sores, or other problems with the feet.  A complete foot exam is done by your health care provider once a year. This exam includes an inspection of the structure and skin of your feet, and a check of the pulses and sensation in your feet.  Type 1 diabetes: Get your first exam 3-5 years after diagnosis.  Type 2 diabetes: Get your first exam as soon as you are diagnosed.  Check your feet every day for cuts, bruises, redness, blisters, or sores. If you have any of these or other problems that are not healing, contact your health care provider. Kidney function test ( urine microalbumin)  This test is done once a year.  Type 1 diabetes: Get your  Get your first test 5 years after diagnosis.  Type 2 diabetes: Get your first test as soon as you are diagnosed.  If you have chronic kidney disease (CKD), get a serum creatinine and estimated glomerular filtration rate (eGFR) test once a year. Lipid profile (cholesterol, HDL, LDL, triglycerides)  This test should be done when you are diagnosed with diabetes, and every 5 years after the first test. If you are on medicines to lower your cholesterol, you may need to get this test done every year.  The goal for LDL is less than  100 mg/dL (5.5 mmol/L). If you are at high risk, the goal is less than 70 mg/dL (3.9 mmol/L).    The goal for triglycerides is less than 150 mg/dL (8.3 mmol/L). Immunizations  The yearly flu (influenza) vaccine is recommended for everyone 6 months or older who has diabetes.  The pneumonia (pneumococcal) vaccine is recommended for everyone 2 years or older who has diabetes. If you are 65 or older, you may get the pneumonia vaccine as a series of two separate shots.  The hepatitis B vaccine is recommended for adults shortly after they have been diagnosed with diabetes.     Mental and emotional health  Screening for symptoms of eating disorders, anxiety, and depression is recommended at the time of diagnosis and afterward as needed. If your screening shows that you have symptoms (you have a positive screening result), you may need further evaluation and be referred to a mental health care provider. Diabetes self-management education  Education about how to manage your diabetes is recommended at diagnosis and ongoing as needed. Treatment plan  Your treatment plan will be reviewed at every medical visit. Summary  Managing diabetes (diabetes mellitus) can be complicated. Your diabetes treatment may be managed by a team of health care providers.  Your health care providers follow a schedule in order to help you get the best quality of care.  Standards of care including having regular physical exams, blood tests, blood pressure monitoring, immunizations, screening tests, and education about how to manage your diabetes.  Your health care providers may also give you more specific instructions based on your individual health. This information is not intended to replace advice given to you by your health care provider. Make sure you discuss any questions you have with your health care provider. Document Released: 03/25/2009 Document Revised: 02/24/2016 Document Reviewed: 02/24/2016 Elsevier  Interactive Patient Education  2017 Elsevier Inc.  

## 2016-09-04 ENCOUNTER — Ambulatory Visit: Payer: Self-pay | Admitting: Endocrinology

## 2016-09-06 ENCOUNTER — Ambulatory Visit: Payer: BLUE CROSS/BLUE SHIELD | Admitting: Family Medicine

## 2016-09-26 ENCOUNTER — Ambulatory Visit: Payer: Self-pay | Admitting: Endocrinology

## 2016-10-15 ENCOUNTER — Encounter: Payer: Self-pay | Admitting: Physician Assistant

## 2016-10-15 ENCOUNTER — Ambulatory Visit (INDEPENDENT_AMBULATORY_CARE_PROVIDER_SITE_OTHER): Payer: BLUE CROSS/BLUE SHIELD | Admitting: Physician Assistant

## 2016-10-15 VITALS — BP 144/76 | HR 103 | Temp 98.8°F | Resp 18 | Ht 72.0 in | Wt 216.2 lb

## 2016-10-15 DIAGNOSIS — M25552 Pain in left hip: Secondary | ICD-10-CM | POA: Diagnosis not present

## 2016-10-15 DIAGNOSIS — M7062 Trochanteric bursitis, left hip: Secondary | ICD-10-CM | POA: Diagnosis not present

## 2016-10-15 NOTE — Progress Notes (Signed)
Jackson Mahoney  MRN: [id number] DOB: 23-Oct-1965  PCP: Patient, No Pcp Per  Subjective:  Pt is a 51 year old male PMH HTN, DM, HLD, chronic renal insufficiency who presents to clinic for hip pain x 1 month. Pain is located on left hip.   Three months ago he was lifting something and felt a "pop" in the back of his left side. This pain was present for a few weeks, then subsided.  Now his pain is located at his left hip. Pain is intermittent.  No pain during sleep. Pain is worse with sitting for long periods of time. He has full ROM, however pain is worse with flexing his hip forward.  He occasionally feels tingling down left leg to his shin.  Denies weakness, numbness, red/hot joint, swelling of his hip, clicking/popping feeling with movement, instability.   Review of Systems  Constitutional: Negative for chills and fever.  Musculoskeletal: Positive for arthralgias (left hip). Negative for gait problem.  Skin: Negative.   Neurological: Negative for weakness and numbness.  Psychiatric/Behavioral: Negative for sleep disturbance.    Patient Active Problem List   Diagnosis Date Noted  . CRI (chronic renal insufficiency), stage 2 (mild) 08/08/2016  . Microalbuminuria due to type 2 diabetes mellitus ([place]) 08/08/2016  . Uncontrolled type 2 diabetes mellitus ([place]) 08/08/2016  . Diabetes mellitus without complication ([place]) 70/96/2836  . Uncontrolled hypertension 07/25/2012  . Mixed hyperlipidemia 07/25/2012    Current Outpatient Prescriptions on File Prior to Visit  Medication Sig Dispense Refill  . aspirin 81 MG EC tablet Take 81 mg by mouth daily. Swallow whole.    Marland Kitchen atorvastatin (LIPITOR) 40 MG tablet Take 1 tablet (40 mg total) by mouth daily. 90 tablet 3  . glipiZIDE (GLUCOTROL XL) 10 MG 24 hr tablet TAKE 1 TABLET BY MOUTH DAILY. 90 tablet 3  . hydrochlorothiazide (HYDRODIURIL) 25 MG tablet Take 1 tablet (25 mg total) by mouth daily. 30 tablet 6  . insulin glargine (LANTUS) 100  UNIT/ML injection Inject 0.2 mLs (20 Units total) into the skin at bedtime. 10 mL 3  . Insulin Pen Needle (BD PEN NEEDLE NANO U/F) 32G X 4 MM MISC Use with insulin pen every evening 100 each 11  . Insulin Syringe-Needle U-100 (B-D INSULIN SYRINGE) 31G X 5/16" 0.3 ML MISC Use to administer insulin daily at bedtime 60 each 3  . lisinopril (PRINIVIL,ZESTRIL) 40 MG tablet Take 1 tablet (40 mg total) by mouth daily. 90 tablet 3  . glucose monitoring kit (FREESTYLE) monitoring kit 1 each by Does not apply route as needed for other. 1 each 0   No current facility-administered medications on file prior to visit.     Allergies  Allergen Reactions  . Wasp Venom Anaphylaxis  . Chloroquine Itching     Objective:  BP (!) 152/82   Pulse (!) 103   Temp 98.8 F (37.1 C) (Oral)   Resp 18   Ht 6' (1.829 m)   Wt 216 lb 3.2 oz (98.1 kg)   SpO2 98%   BMI 29.32 kg/m   Physical Exam  Constitutional: He is oriented to person, place, and time and well-developed, well-nourished, and in no distress. No distress.  Cardiovascular: Normal rate, regular rhythm and normal heart sounds.   Musculoskeletal:       Left hip: He exhibits tenderness (greater trochanter). He exhibits normal range of motion, normal strength, no bony tenderness, no swelling and no crepitus.  Full ROM. Pain with flexion of hip. No skin changes.  Joint is not warm to touch.   Neurological: He is alert and oriented to person, place, and time. GCS score is 15.  Skin: Skin is warm and dry.  Psychiatric: Mood, memory, affect and judgment normal.  Vitals reviewed.   Lab Results  Component Value Date   HGBA1C 14.0 08/06/2016    Assessment and Plan :  1. Trochanteric bursitis of left hip 2. Pain of left hip joint -  Will not treat with steroids today - last hemoglobin A1C on 07/2016 was 14%. Supportive care: Tylenol for pain, heating pad, stretches and exercise. RTC in 3-4 weeks if no improvement. Consider referral to ortho. He agrees  with plan.  Mercer Pod, PA-C  [facility] 10/15/2016 1:41 PM

## 2016-10-15 NOTE — Patient Instructions (Addendum)
acetaminophen (Tylenol) 2 to 3 g/day for two weeks, the use of a heating pad, and a neutral sitting position in which much of the weight is distributed on the thighs rather than the ischia  exercises for the abdomen, back, hip, and thigh muscles; and encouragement of aerobic activities.   Trochanteric Bursitis Rehab Ask your health care provider which exercises are safe for you. Do exercises exactly as told by your health care provider and adjust them as directed. It is normal to feel mild stretching, pulling, tightness, or discomfort as you do these exercises, but you should stop right away if you feel sudden pain or your pain gets worse.Do not begin these exercises until told by your health care provider. Stretching exercises These exercises warm up your muscles and joints and improve the movement and flexibility of your hip. These exercises also help to relieve pain and stiffness. Exercise A: Iliotibial band stretch   1. Lie on your side with your left / right leg in the top position. 2. Bend your left / right knee and grab your ankle. 3. Slowly bring your knee back so your thigh is behind your body. 4. Slowly lower your knee toward the floor until you feel a gentle stretch on the outside of your left / right thigh. If you do not feel a stretch and your knee will not fall farther, place the heel of your other foot on top of your outer knee and pull your thigh down farther. 5. Hold this position for __________ seconds. 6. Slowly return to the starting position. Repeat __________ times. Complete this exercise __________ times a day. Strengthening exercises These exercises build strength and endurance in your hip and pelvis. Endurance is the ability to use your muscles for a long time, even after they get tired. Exercise B: Bridge (  hip extensors) 1. Lie on your back on a firm surface with your knees bent and your feet flat on the floor. 2. Tighten your buttocks muscles and lift your buttocks  off the floor until your trunk is level with your thighs. You should feel the muscles working in your buttocks and the back of your thighs. If this exercise is too easy, try doing it with your arms crossed over your chest. 3. Hold this position for __________ seconds. 4. Slowly return to the starting position. 5. Let your muscles relax completely between repetitions. Repeat __________ times. Complete this exercise __________ times a day. Exercise C: Squats ( knee extensors and  quadriceps) 1. Stand in front of a table, with your feet and knees pointing straight ahead. You may rest your hands on the table for balance but not for support. 2. Slowly bend your knees and lower your hips like you are going to sit in a chair.  Keep your weight over your heels, not over your toes.  Keep your lower legs upright so they are parallel with the table legs.  Do not let your hips go lower than your knees.  Do not bend lower than told by your health care provider.  If your hip pain increases, do not bend as low. 3. Hold this position for __________ seconds. 4. Slowly push with your legs to return to standing. Do not use your hands to pull yourself to standing. Repeat __________ times. Complete this exercise __________ times a day. Exercise D: Hip hike 1. Stand sideways on a bottom step. Stand on your left / right leg with your other foot unsupported next to the step. You can  hold onto the railing or wall if needed for balance. 2. Keeping your knees straight and your torso square, lift your left / right hip up toward the ceiling. 3. Hold this position for __________ seconds. 4. Slowly let your left / right hip lower toward the floor, past the starting position. Your foot should get closer to the floor. Do not lean or bend your knees. Repeat __________ times. Complete this exercise __________ times a day. Exercise E: Single leg stand 1. Stand near a counter or door frame that you can hold onto for balance  as needed. It is helpful to stand in front of a mirror for this exercise so you can watch your hip. 2. Squeeze your left / right buttock muscles then lift up your other foot. Do not let your left / right hip push out to the side. 3. Hold this position for __________ seconds. Repeat __________ times. Complete this exercise __________ times a day. This information is not intended to replace advice given to you by your health care provider. Make sure you discuss any questions you have with your health care provider. Document Released: 07/05/2004 Document Revised: 02/02/2016 Document Reviewed: 05/13/2015 Elsevier Interactive Patient Education  2017 [place].  Please perform the below exercises:   Cross-leg pulls - The affected leg is crossed over the other leg while sitting with the spine in a neutral position in a straight-backed chair or on the floor. Grasp the knee on the affected side, and pull the leg to the opposite side. Be sure to keep the buttocks flat, avoiding twisting the back or rolling the buttocks. A gentle pulling sensation should be felt in the outer hip and buttock areas. Sharp pain suggests excessive stretching and irritation of the bursa. The stretch should be repeated 20 times.  Knee-chest pulls - Knee-chest pulls should be performed lying on a firm, padded surface. Grasp your upper lower leg and slowly bring your knee slowly up to your chest, holding it in place for five seconds. Relax the buttock and lower back muscles. Perform sets of 15 to 20 stretches on the right side, then the left side, and finally both legs simultaneously (curling up in the fetal position).    Side bends - Side bends increase the flexibility of the lower backand oblique abdominal muscles.While lying down, crawl your fingers down the side of your thigh.Hold this position forfive seconds.Return to the neutral position.Repeat on the other side.Sets of 20 repetitions should be performed  daily.Initially, this exercise should be performed while lying down or while floating in water.With improvement in flexibility and stiffness, this exercise can be performed sitting in a straight-back chair or standing.  Pelvic rock exercise - The pelvic rock exercise can be performed on all fours or while lying on the back. With the knees bent, rotate your pelvis forward then backwards.The abdominal muscles are used to rotate backwards, while the back muscles rotate forward (arching the back).Each position is held forfive seconds andrepeatedin sets of20.Caution: Do not overextend when arching the back.  Come back if your pain lasts >4 weeks.   Thank you for coming in today. I hope you feel we met your needs.  Feel free to call [facility] if you have any questions or further requests.  Please consider signing up for MyChart if you do not already have it, as this is a great way to communicate with me.  Best,  Whitney McVey, PA-C   IF you received an x-ray today, you will receive an invoice  from [facility]. Please contact [facility] at [phone] with questions or concerns regarding your invoice.   IF you received labwork today, you will receive an invoice from [facility]. Please contact [facility] at [phone] with questions or concerns regarding your invoice.   Our billing staff will not be able to assist you with questions regarding bills from these companies.  You will be contacted with the lab results as soon as they are available. The fastest way to get your results is to activate your My Chart account. Instructions are located on the last page of this paperwork. If you have not heard from [place] regarding the results in 2 weeks, please contact this office.

## 2016-11-15 ENCOUNTER — Telehealth: Payer: Self-pay | Admitting: Physician Assistant

## 2016-11-15 NOTE — Telephone Encounter (Signed)
I called pt to inform him that his medical records are ready for pick up.

## 2017-01-02 ENCOUNTER — Ambulatory Visit (INDEPENDENT_AMBULATORY_CARE_PROVIDER_SITE_OTHER): Payer: BLUE CROSS/BLUE SHIELD | Admitting: Family Medicine

## 2017-01-02 ENCOUNTER — Encounter: Payer: Self-pay | Admitting: Family Medicine

## 2017-01-02 VITALS — BP 155/87 | HR 100 | Temp 98.4°F | Resp 16 | Ht 72.0 in | Wt 216.6 lb

## 2017-01-02 DIAGNOSIS — E119 Type 2 diabetes mellitus without complications: Secondary | ICD-10-CM

## 2017-01-02 DIAGNOSIS — E1165 Type 2 diabetes mellitus with hyperglycemia: Secondary | ICD-10-CM | POA: Diagnosis not present

## 2017-01-02 DIAGNOSIS — I10 Essential (primary) hypertension: Secondary | ICD-10-CM

## 2017-01-02 DIAGNOSIS — E785 Hyperlipidemia, unspecified: Secondary | ICD-10-CM

## 2017-01-02 LAB — POCT GLYCOSYLATED HEMOGLOBIN (HGB A1C): Hemoglobin A1C: 13

## 2017-01-02 MED ORDER — HYDROCHLOROTHIAZIDE 25 MG PO TABS: 25.0000 mg | ORAL_TABLET | Freq: Every day | ORAL | 6 refills | Status: DC

## 2017-01-02 MED ORDER — GLIPIZIDE 5 MG PO TABS: 5.0000 mg | ORAL_TABLET | Freq: Two times a day (BID) | ORAL | 1 refills | Status: DC

## 2017-01-02 NOTE — Patient Instructions (Addendum)
For fasting glucose consistent less than 100-120 then decrease your insulin by 2 units daily to keep your sugars at 100-120  If your sugars start to run low then eat string cheese or drink milk to correct the sugar. Do not use juice because it makes the sugar too high.     IF you received an x-ray today, you will receive an invoice from [facility]. Please contact [facility] at [phone] with questions or concerns regarding your invoice.   IF you received labwork today, you will receive an invoice from [facility]. Please contact [facility] at [phone] with questions or concerns regarding your invoice.   Our billing staff will not be able to assist you with questions regarding bills from these companies.  You will be contacted with the lab results as soon as they are available. The fastest way to get your results is to activate your My Chart account. Instructions are located on the last page of this paperwork. If you have not heard from [place] regarding the results in 2 weeks, please contact this office.     Hypoglycemia Hypoglycemia occurs when the level of sugar (glucose) in the blood is too low. Glucose is a type of sugar that provides the body's main source of energy. Certain hormones (insulin and glucagon) control the level of glucose in the blood. Insulin lowers blood glucose, and glucagon increases blood glucose. Hypoglycemia can result from having too much insulin in the bloodstream, or from not eating enough food that contains glucose. Hypoglycemia can happen in people who do or do not have diabetes. It can develop quickly, and it can be a medical emergency. What are the causes? Hypoglycemia occurs most often in people who have diabetes. If you have diabetes, hypoglycemia may be caused by:  Diabetes medicine.  Not eating enough, or not eating often enough.  Increased physical activity.  Drinking alcohol, especially when you have not eaten recently.  If you do  not have diabetes, hypoglycemia may be caused by:  A tumor in the pancreas. The pancreas is the organ that makes insulin.  Not eating enough, or not eating for long periods at a time (fasting).  Severe infection or illness that affects the liver, heart, or kidneys.  Certain medicines.  You may also have reactive hypoglycemia. This condition causes hypoglycemia within 4 hours of eating a meal. This may occur after having stomach surgery. Sometimes, the cause of reactive hypoglycemia is not known. What increases the risk? Hypoglycemia is more likely to develop in:  People who have diabetes and take medicines to lower blood glucose.  People who abuse alcohol.  People who have a severe illness.  What are the signs or symptoms? Hypoglycemia may not cause any symptoms. If you have symptoms, they may include:  Hunger.  Anxiety.  Sweating and feeling clammy.  Confusion.  Dizziness or feeling light-headed.  Sleepiness.  Nausea.  Increased heart rate.  Headache.  Blurry vision.  Seizure.  Nightmares.  Tingling or numbness around the mouth, lips, or tongue.  A change in speech.  Decreased ability to concentrate.  A change in coordination.  Restless sleep.  Tremors or shakes.  Fainting.  Irritability.  How is this diagnosed? Hypoglycemia is diagnosed with a blood test to measure your blood glucose level. This blood test is done while you are having symptoms. Your health care provider may also do a physical exam and review your medical history. If you do not have diabetes, other tests may be done to find the  cause of your hypoglycemia. How is this treated? This condition can often be treated by immediately eating or drinking something that contains glucose, such as:  3-4 sugar tablets (glucose pills).  Glucose gel, 15-gram tube.  Fruit juice, 4 oz (120 mL).  Regular soda (not diet soda), 4 oz (120 mL).  Low-fat milk, 4 oz (120 mL).  Several pieces of  hard candy.  Sugar or honey, 1 Tbsp.  Treating Hypoglycemia If You Have Diabetes  If you are alert and able to swallow safely, follow the 15:15 rule:  Take 15 grams of a rapid-acting carbohydrate. Rapid-acting options include: ? 1 tube of glucose gel. ? 3 glucose pills. ? 6-8 pieces of hard candy. ? 4 oz (120 mL) of fruit juice. ? 4 oz (120 ml) of regular (not diet) soda.  Check your blood glucose 15 minutes after you take the carbohydrate.  If the repeat blood glucose level is still at or below 70 mg/dL (3.9 mmol/L), take 15 grams of a carbohydrate again.  If your blood glucose level does not increase above 70 mg/dL (3.9 mmol/L) after 3 tries, seek emergency medical care.  After your blood glucose level returns to normal, eat a meal or a snack within 1 hour.  Treating Severe Hypoglycemia Severe hypoglycemia is when your blood glucose level is at or below 54 mg/dL (3 mmol/L). Severe hypoglycemia is an emergency. Do not wait to see if the symptoms will go away. Get medical help right away. Call your local emergency services (911 in the [place] Do not drive yourself to the hospital. If you have severe hypoglycemia and you cannot eat or drink, you may need an injection of glucagon. A family member or close friend should learn how to check your blood glucose and how to give you a glucagon injection. Ask your health care provider if you need to have an emergency glucagon injection kit available. Severe hypoglycemia may need to be treated in a hospital. The treatment may include getting glucose through an IV tube. You may also need treatment for the cause of your hypoglycemia. Follow these instructions at home: General instructions  Avoid any diets that cause you to not eat enough food. Talk with your health care provider before you start any new diet.  Take over-the-counter and prescription medicines only as told by your health care provider.  Limit alcohol intake to no more than 1 drink  per day for nonpregnant women and 2 drinks per day for men. One drink equals 12 oz of beer, 5 oz of wine, or 1 oz of hard liquor.  Keep all follow-up visits as told by your health care provider. This is important. If You Have Diabetes:   Make sure you know the symptoms of hypoglycemia.  Always have a rapid-acting carbohydrate snack with you to treat low blood sugar.  Follow your diabetes management plan, as told by your health care provider. Make sure you: ? Take your medicines as directed. ? Follow your exercise plan. ? Follow your meal plan. Eat on time, and do not skip meals. ? Check your blood glucose as often as directed. Make sure to check your blood glucose before and after exercise. If you exercise longer or in a different way than usual, check your blood glucose more often. ? Follow your sick day plan whenever you cannot eat or drink normally. Make this plan in advance with your health care provider.  Share your diabetes management plan with people in your workplace, school, and household.  Check your urine for ketones when you are ill and as told by your health care provider.  Carry a medical alert card or wear medical alert jewelry. If You Have Reactive Hypoglycemia or Low Blood Sugar From Other Causes:  Monitor your blood glucose as told by your health care provider.  Follow instructions from your health care provider about eating or drinking restrictions. Contact a health care provider if:  You have problems keeping your blood glucose in your target range.  You have frequent episodes of hypoglycemia. Get help right away if:  You continue to have hypoglycemia symptoms after eating or drinking something containing glucose.  Your blood glucose is at or below 54 mg/dL (3 mmol/L).  You have a seizure.  You faint. These symptoms may represent a serious problem that is an emergency. Do not wait to see if the symptoms will go away. Get medical help right away. Call your  local emergency services (911 in the [place] Do not drive yourself to the hospital. This information is not intended to replace advice given to you by your health care provider. Make sure you discuss any questions you have with your health care provider. Document Released: 05/28/2005 Document Revised: 11/09/2015 Document Reviewed: 07/01/2015 Elsevier Interactive Patient Education  [employer].

## 2017-01-02 NOTE — Progress Notes (Signed)
Chief Complaint  Patient presents with  . Diabetes    check    HPI  Diabetes  Pt reports that he changed how he was eating and started exercising by running He states that he would eat and within a short time he is hungry and would feel that he needed to drink orange juice He states that he sugars would drop after exercise.  He reports that he would run 2 miles and would also drink water  Lab Results  Component Value Date   HGBA1C 13.0 % 01/02/2017    He reports that his fasting glucose is 109 He states that for almost a month he was adjust his insulin  He states that he would cut down to 10 units of insulin depending on his sugars He states that his biggest program was the glipizide He reports that he would skip the insulin if he knows he will be eating small meals.   Breakfast Eggs and a slice of bread and He eats about 3 at a time.  Lunch Croissant with chicken salad that he buys  [employer], spinach, meat stew  Hypertension: Patient here for follow-up of elevated blood pressure. He is exercising and is adherent to low salt diet.  Blood pressure is well controlled at home. Cardiac symptoms none. Patient denies chest pain, dyspnea, exertional chest pressure/discomfort, fatigue and lower extremity edema.   BP Readings from Last 3 Encounters:  01/02/17 (!) 155/87  10/15/16 (!) 144/76  08/29/16 (!) 177/85     Past Medical History:  Diagnosis Date  . Diabetes mellitus without complication ([place])   . Hyperlipidemia   . Hypertension     Current Outpatient Prescriptions  Medication Sig Dispense Refill  . aspirin 81 MG EC tablet Take 81 mg by mouth daily. Swallow whole.    Marland Kitchen atorvastatin (LIPITOR) 40 MG tablet Take 1 tablet (40 mg total) by mouth daily. 90 tablet 3  . hydrochlorothiazide (HYDRODIURIL) 25 MG tablet Take 1 tablet (25 mg total) by mouth daily. 30 tablet 6  . insulin glargine (LANTUS) 100 UNIT/ML injection Inject 0.2 mLs (20 Units total) into  the skin at bedtime. 10 mL 3  . Insulin Pen Needle (BD PEN NEEDLE NANO U/F) 32G X 4 MM MISC Use with insulin pen every evening 100 each 11  . Insulin Syringe-Needle U-100 (B-D INSULIN SYRINGE) 31G X 5/16" 0.3 ML MISC Use to administer insulin daily at bedtime 60 each 3  . lisinopril (PRINIVIL,ZESTRIL) 40 MG tablet Take 1 tablet (40 mg total) by mouth daily. 90 tablet 3  . glipiZIDE (GLUCOTROL) 5 MG tablet Take 1 tablet (5 mg total) by mouth 2 (two) times daily before a meal. 60 tablet 1  . glucose monitoring kit (FREESTYLE) monitoring kit 1 each by Does not apply route as needed for other. 1 each 0   No current facility-administered medications for this visit.     Allergies:  Allergies  Allergen Reactions  . Wasp Venom Anaphylaxis  . Chloroquine Itching    History reviewed. No pertinent surgical history.  Social History   Social History  . Marital status: Single    Spouse name: N/A  . Number of children: N/A  . Years of education: N/A   Social History Main Topics  . Smoking status: Never Smoker  . Smokeless tobacco: Never Used  . Alcohol use 1.2 oz/week    2 Cans of beer per week  . Drug use: No  . Sexual activity: No   Other Topics  Concern  . None   Social History Narrative  . None    ROS Review of Systems See HPI [place]: No fevers or chills No malaise No diaphoresis Skin: No rash or itching Eyes: no blurry vision, no double vision GU: no dysuria or hematuria Neuro: no dizziness or headaches   Objective: Vitals:   01/02/17 1615 01/02/17 1623  BP: (!) 169/97 (!) 155/87  Pulse: (!) 106 100  Resp: 16   Temp: 98.4 F (36.9 C)   TempSrc: Oral   SpO2: 97%   Weight: 216 lb 9.6 oz (98.2 kg)   Height: 6' (1.829 m)     Physical Exam  Constitutional: He is oriented to person, place, and time. He appears well-developed and well-nourished.  HENT:  Head: Normocephalic and atraumatic.  Eyes: Conjunctivae and EOM are normal.  Cardiovascular: Normal  rate, regular rhythm and normal heart sounds.   Pulmonary/Chest: Effort normal and breath sounds normal. No respiratory distress. He has no wheezes.  Neurological: He is alert and oriented to person, place, and time.    Assessment and Plan Natalie was seen today for diabetes.  Diagnoses and all orders for this visit:  Diabetes mellitus without complication ([place])  Uncontrolled type 2 diabetes mellitus with hyperglycemia, without long-term current use of insulin ([place])  Advised pt to continue on glipizide but from XL since pt was having  Hypoglycemic symptoms Will check fructosamine to see if pt has been having tighter control recently -     POCT glycosylated hemoglobin (Hb A1C) -     Comprehensive metabolic panel -     Fructosamine  Dyslipidemia- will assess for improvement with dietary changes -     Lipid panel -     Comprehensive metabolic panel  Essential hypertension - slight improvement, will continue current meds until next visit  Other orders -     Cancel: Microalbumin, urine -     Cancel: POCT urinalysis dipstick -     glipiZIDE (GLUCOTROL) 5 MG tablet; Take 1 tablet (5 mg total) by mouth 2 (two) times daily before a meal. -     hydrochlorothiazide (HYDRODIURIL) 25 MG tablet; Take 1 tablet (25 mg total) by mouth daily.   6 weeks for diabetes  Caylah Plouff A Nolon Rod

## 2017-01-03 ENCOUNTER — Other Ambulatory Visit: Payer: Self-pay | Admitting: Family Medicine

## 2017-01-03 LAB — COMPREHENSIVE METABOLIC PANEL
ALT: 11 IU/L (ref 0–44)
AST: 15 IU/L (ref 0–40)
Albumin/Globulin Ratio: 1.2 (ref 1.2–2.2)
Albumin: 4.1 g/dL (ref 3.5–5.5)
Alkaline Phosphatase: 85 IU/L (ref 39–117)
BUN/Creatinine Ratio: 17 (ref 9–20)
BUN: 24 mg/dL (ref 6–24)
Bilirubin Total: <0.2 mg/dL (ref 0.0–1.2)
CO2: 22 mmol/L (ref 20–29)
Calcium: 9.8 mg/dL (ref 8.7–10.2)
Chloride: 97 mmol/L (ref 96–106)
Creatinine, Ser: 1.42 mg/dL — ABNORMAL HIGH (ref 0.76–1.27)
GFR calc Af Amer: 66 mL/min/{1.73_m2} (ref >59)
GFR calc non Af Amer: 57 mL/min/{1.73_m2} — ABNORMAL LOW (ref >59)
Globulin, Total: 3.3 g/dL (ref 1.5–4.5)
Glucose: 536 mg/dL (ref 65–99)
Potassium: 5 mmol/L (ref 3.5–5.2)
Sodium: 134 mmol/L (ref 134–144)
Total Protein: 7.4 g/dL (ref 6.0–8.5)

## 2017-01-03 LAB — FRUCTOSAMINE

## 2017-01-03 LAB — LIPID PANEL
Chol/HDL Ratio: 4.4 ratio (ref 0.0–5.0)
Cholesterol, Total: 162 mg/dL (ref 100–199)
HDL: 37 mg/dL — ABNORMAL LOW (ref >39)
LDL Calculated: 111 mg/dL — ABNORMAL HIGH (ref 0–99)
Triglycerides: 72 mg/dL (ref 0–149)
VLDL Cholesterol Cal: 14 mg/dL (ref 5–40)

## 2017-01-03 MED ORDER — DAPAGLIFLOZIN PROPANEDIOL 10 MG PO TABS: 10.0000 mg | ORAL_TABLET | Freq: Every day | ORAL | 1 refills | Status: DC

## 2017-01-03 NOTE — Progress Notes (Signed)
This provider called [facility] about Jackson DeistFarxiga The patient has a high deductible plan and needs to get on a medication to improve his diabetes.  He is afraid of cost. Let him know there is a program that he can enroll in over the phone by calling [phone] and Press 0 to get to agent. He can save up to $435 savings card Expiration date 06/10/2017 and can re-enroll  This medication has a lot of warnings the main thing is that it lowers blood pressure and blood sugar But it can cause increase urination and dehydration.  His kidney function test on the labs yesterday are slightly impaired and I suspect is because he sugars are so high.  He should eat smaller more frequent meals and check his sugars three times a day He should move his appointment up to one month follow up  I left him a voicemail stating all that but did not leave the phone number. He will call back to get the phone number.

## 2017-01-04 LAB — SPECIMEN STATUS REPORT

## 2017-01-04 LAB — FRUCTOSAMINE: Fructosamine: 535 umol/L — ABNORMAL HIGH (ref 0–285)

## 2017-01-28 ENCOUNTER — Telehealth: Payer: Self-pay | Admitting: Family Medicine

## 2017-01-28 DIAGNOSIS — E11649 Type 2 diabetes mellitus with hypoglycemia without coma: Secondary | ICD-10-CM

## 2017-01-28 DIAGNOSIS — Z794 Long term (current) use of insulin: Secondary | ICD-10-CM

## 2017-01-28 NOTE — Telephone Encounter (Signed)
Pt states he has stop taking his dapagliflozin propanediol (FARXIGA) 10 MG about 2 weeks ago because it was making him dizzy and very weak.  He wanted to know is there anything else you can call in for him.  Please adv,  Contact number [phone]

## 2017-01-29 ENCOUNTER — Ambulatory Visit (INDEPENDENT_AMBULATORY_CARE_PROVIDER_SITE_OTHER): Payer: BLUE CROSS/BLUE SHIELD | Admitting: Family Medicine

## 2017-01-29 DIAGNOSIS — E11649 Type 2 diabetes mellitus with hypoglycemia without coma: Secondary | ICD-10-CM | POA: Diagnosis not present

## 2017-01-29 DIAGNOSIS — Z794 Long term (current) use of insulin: Secondary | ICD-10-CM

## 2017-01-29 LAB — POCT GLYCOSYLATED HEMOGLOBIN (HGB A1C): Hemoglobin A1C: 12.3

## 2017-01-29 NOTE — Telephone Encounter (Signed)
Pt reports hypoglycemia He stopped his Marcelline Deist Will come in to get point of care a1c after work today and if possible will make him an office visit.

## 2017-01-30 NOTE — Progress Notes (Signed)
Lab only visit 

## 2017-01-31 ENCOUNTER — Ambulatory Visit: Payer: Self-pay | Admitting: Endocrinology

## 2017-01-31 NOTE — Telephone Encounter (Signed)
Pt missed his CB. Please return. [phone]

## 2017-01-31 NOTE — Telephone Encounter (Signed)
Left message for patient that his a1c is 12.3%.  I would like him to resume his farxiga at a half tablet.  He should follow up in the office to discuss his labs and talk about other measures. Please let him know that his a1c is decreasing from 14 to 13 to 12.

## 2017-02-04 NOTE — Telephone Encounter (Signed)
L/m message to call back.

## 2017-02-08 DIAGNOSIS — E119 Type 2 diabetes mellitus without complications: Secondary | ICD-10-CM | POA: Diagnosis not present

## 2017-02-08 DIAGNOSIS — R071 Chest pain on breathing: Secondary | ICD-10-CM | POA: Diagnosis not present

## 2017-02-08 DIAGNOSIS — Z125 Encounter for screening for malignant neoplasm of prostate: Secondary | ICD-10-CM | POA: Diagnosis not present

## 2017-02-13 ENCOUNTER — Ambulatory Visit: Payer: BLUE CROSS/BLUE SHIELD | Admitting: Family Medicine

## 2017-02-21 DIAGNOSIS — E119 Type 2 diabetes mellitus without complications: Secondary | ICD-10-CM | POA: Diagnosis not present

## 2017-02-21 DIAGNOSIS — E784 Other hyperlipidemia: Secondary | ICD-10-CM | POA: Diagnosis not present

## 2017-02-21 DIAGNOSIS — R071 Chest pain on breathing: Secondary | ICD-10-CM | POA: Diagnosis not present

## 2017-03-04 ENCOUNTER — Ambulatory Visit: Payer: BLUE CROSS/BLUE SHIELD | Admitting: Family Medicine

## 2017-03-04 NOTE — Progress Notes (Deleted)
  No chief complaint on file.   HPI  Past Medical History:  Diagnosis Date  . Diabetes mellitus without complication ([facility])   . Hyperlipidemia   . Hypertension     Current Outpatient Prescriptions  Medication Sig Dispense Refill  . aspirin 81 MG EC tablet Take 81 mg by mouth daily. Swallow whole.    . atorvastatin (LIPITOR) 40 MG tablet Take 1 tablet (40 mg total) by mouth daily. 90 tablet 3  . dapagliflozin propanediol (FARXIGA) 10 MG TABS tablet Take 10 mg by mouth daily. 30 tablet 1  . glipiZIDE (GLUCOTROL) 5 MG tablet Take 1 tablet (5 mg total) by mouth 2 (two) times daily before a meal. 60 tablet 1  . glucose monitoring kit (FREESTYLE) monitoring kit 1 each by Does not apply route as needed for other. 1 each 0  . hydrochlorothiazide (HYDRODIURIL) 25 MG tablet Take 1 tablet (25 mg total) by mouth daily. 30 tablet 6  . insulin glargine (LANTUS) 100 UNIT/ML injection Inject 0.2 mLs (20 Units total) into the skin at bedtime. 10 mL 3  . Insulin Pen Needle (BD PEN NEEDLE NANO U/F) 32G X 4 MM MISC Use with insulin pen every evening 100 each 11  . Insulin Syringe-Needle U-100 (B-D INSULIN SYRINGE) 31G X 5/16" 0.3 ML MISC Use to administer insulin daily at bedtime 60 each 3  . lisinopril (PRINIVIL,ZESTRIL) 40 MG tablet Take 1 tablet (40 mg total) by mouth daily. 90 tablet 3   No current facility-administered medications for this visit.     Allergies:  Allergies  Allergen Reactions  . Wasp Venom Anaphylaxis  . Chloroquine Itching    No past surgical history on file.  Social History   Social History  . Marital status: Single    Spouse name: N/A  . Number of children: N/A  . Years of education: N/A   Social History Main Topics  . Smoking status: Never Smoker  . Smokeless tobacco: Never Used  . Alcohol use 1.2 oz/week    2 Cans of beer per week  . Drug use: No  . Sexual activity: No   Other Topics Concern  . Not on file   Social History Narrative  . No narrative on  file    ROS  Objective: There were no vitals filed for this visit.  Physical Exam  Assessment and Plan There are no diagnoses linked to this encounter.   Jackson Mahoney 

## 2017-03-06 NOTE — Progress Notes (Deleted)
  No chief complaint on file.   HPI  Past Medical History:  Diagnosis Date  . Diabetes mellitus without complication ([place])   . Hyperlipidemia   . Hypertension     Current Outpatient Prescriptions  Medication Sig Dispense Refill  . aspirin 81 MG EC tablet Take 81 mg by mouth daily. Swallow whole.    Marland Kitchen atorvastatin (LIPITOR) 40 MG tablet Take 1 tablet (40 mg total) by mouth daily. 90 tablet 3  . dapagliflozin propanediol (FARXIGA) 10 MG TABS tablet Take 10 mg by mouth daily. 30 tablet 1  . glipiZIDE (GLUCOTROL) 5 MG tablet Take 1 tablet (5 mg total) by mouth 2 (two) times daily before a meal. 60 tablet 1  . glucose monitoring kit (FREESTYLE) monitoring kit 1 each by Does not apply route as needed for other. 1 each 0  . hydrochlorothiazide (HYDRODIURIL) 25 MG tablet Take 1 tablet (25 mg total) by mouth daily. 30 tablet 6  . insulin glargine (LANTUS) 100 UNIT/ML injection Inject 0.2 mLs (20 Units total) into the skin at bedtime. 10 mL 3  . Insulin Pen Needle (BD PEN NEEDLE NANO U/F) 32G X 4 MM MISC Use with insulin pen every evening 100 each 11  . Insulin Syringe-Needle U-100 (B-D INSULIN SYRINGE) 31G X 5/16" 0.3 ML MISC Use to administer insulin daily at bedtime 60 each 3  . lisinopril (PRINIVIL,ZESTRIL) 40 MG tablet Take 1 tablet (40 mg total) by mouth daily. 90 tablet 3   No current facility-administered medications for this visit.     Allergies:  Allergies  Allergen Reactions  . Wasp Venom Anaphylaxis  . Chloroquine Itching    No past surgical history on file.  Social History   Social History  . Marital status: Single    Spouse name: N/A  . Number of children: N/A  . Years of education: N/A   Social History Main Topics  . Smoking status: Never Smoker  . Smokeless tobacco: Never Used  . Alcohol use 1.2 oz/week    2 Cans of beer per week  . Drug use: No  . Sexual activity: No   Other Topics Concern  . Not on file   Social History Narrative  . No narrative on  file    ROS  Objective: There were no vitals filed for this visit.  Physical Exam  Assessment and Plan There are no diagnoses linked to this encounter.   Jackson Mahoney

## 2017-03-07 ENCOUNTER — Ambulatory Visit: Payer: BLUE CROSS/BLUE SHIELD | Admitting: Family Medicine

## 2017-04-03 ENCOUNTER — Other Ambulatory Visit: Payer: Self-pay | Admitting: Family Medicine

## 2017-04-04 NOTE — Telephone Encounter (Signed)
Refill request for glipizide 5mg  #60 approved with no refills.  Pt needs a follow up diabetes check with [facility].  Will  Send to schedulers to call pt and schedule.

## 2017-04-11 ENCOUNTER — Other Ambulatory Visit: Payer: Self-pay | Admitting: Family Medicine

## 2017-04-11 DIAGNOSIS — Z23 Encounter for immunization: Secondary | ICD-10-CM | POA: Diagnosis not present

## 2017-05-21 ENCOUNTER — Other Ambulatory Visit: Payer: Self-pay | Admitting: Family Medicine

## 2017-06-01 ENCOUNTER — Encounter: Payer: Self-pay | Admitting: Physician Assistant

## 2017-06-01 ENCOUNTER — Ambulatory Visit (INDEPENDENT_AMBULATORY_CARE_PROVIDER_SITE_OTHER): Payer: BLUE CROSS/BLUE SHIELD

## 2017-06-01 ENCOUNTER — Ambulatory Visit: Payer: BLUE CROSS/BLUE SHIELD | Admitting: Physician Assistant

## 2017-06-01 VITALS — BP 130/84 | HR 90 | Temp 98.8°F | Resp 18 | Ht 71.0 in | Wt 216.0 lb

## 2017-06-01 DIAGNOSIS — M25521 Pain in right elbow: Secondary | ICD-10-CM | POA: Diagnosis not present

## 2017-06-01 DIAGNOSIS — S59901A Unspecified injury of right elbow, initial encounter: Secondary | ICD-10-CM | POA: Diagnosis not present

## 2017-06-01 LAB — POCT CBC
Granulocyte percent: 59.2 %G (ref 37–80)
HCT, POC: 37.6 % — AB (ref 43.5–53.7)
Hemoglobin: 12.1 g/dL — AB (ref 14.1–18.1)
Lymph, poc: 1.8 (ref 0.6–3.4)
MCH, POC: 27.3 pg (ref 27–31.2)
MCHC: 32.3 g/dL (ref 31.8–35.4)
MCV: 84.8 fL (ref 80–97)
MID (cbc): 0.3 (ref 0–0.9)
MPV: 9 fL (ref 0–99.8)
POC Granulocyte: 3.1 (ref 2–6.9)
POC LYMPH PERCENT: 35.1 %L (ref 10–50)
POC MID %: 5.7 %M (ref 0–12)
Platelet Count, POC: 226 10*3/uL (ref 142–424)
RBC: 4.43 M/uL — AB (ref 4.69–6.13)
RDW, POC: 15 %
WBC: 5.2 10*3/uL (ref 4.6–10.2)

## 2017-06-01 LAB — GLUCOSE, POCT (MANUAL RESULT ENTRY): POC Glucose: 170 mg/dl — AB (ref 70–99)

## 2017-06-01 MED ORDER — TRAMADOL HCL 50 MG PO TABS: 50.0000 mg | ORAL_TABLET | Freq: Three times a day (TID) | ORAL | 0 refills | Status: DC | PRN

## 2017-06-01 NOTE — Progress Notes (Signed)
06/01/2017 5:06 PM   DOB: 10-03-1965 / MRN: [id number]  SUBJECTIVE:  Jackson Mahoney is a 51 y.o. male with a history of very poorly controlled diabetes presenting for right elbow pain that started after moving some furniture about 1 week ago. He is allergic to chloroquine.  He has tried Ibuprofen   He is allergic to wasp venom and chloroquine.   He  has a past medical history of Diabetes mellitus without complication (HCC), Hyperlipidemia, and Hypertension.    He  reports that  has never smoked. he has never used smokeless tobacco. He reports that he drinks about 1.2 oz of alcohol per week. He reports that he does not use drugs. He  reports that he does not engage in sexual activity. The patient  has no past surgical history on file.  His family history includes Diabetes in his father.  Review of Systems  Constitutional: Negative for chills, diaphoresis and fever.  Eyes: Negative.   Respiratory: Negative for shortness of breath.   Cardiovascular: Negative for chest pain, orthopnea and leg swelling.  Gastrointestinal: Negative for abdominal pain, blood in stool, constipation, diarrhea, heartburn, melena, nausea and vomiting.  Genitourinary: Negative for flank pain.  Skin: Negative for rash.  Neurological: Negative for dizziness, sensory change, speech change, focal weakness and headaches.    The problem list and medications were reviewed and updated by myself where necessary and exist elsewhere in the encounter.   OBJECTIVE:  BP 130/84   Pulse 90   Temp 98.8 F (37.1 C) (Oral)   Resp 18   Ht 5\' 11"  (1.803 m)   Wt 216 lb (98 kg)   SpO2 99%   BMI 30.13 kg/m   Physical Exam  Constitutional: He is active and cooperative.  Cardiovascular: Normal rate, regular rhythm, S1 normal, S2 normal, normal heart sounds, intact distal pulses and normal pulses. Exam reveals no gallop and no friction rub.  No murmur heard. Pulmonary/Chest: Effort normal. No tachypnea. He has no rales.    Abdominal: He exhibits no distension.  Musculoskeletal: He exhibits no edema.  Neurological: He is alert.  Skin: Skin is warm.  Vitals reviewed.   Results for orders placed or performed in visit on 06/01/17 (from the past 72 hour(s))  POCT CBC     Status: Abnormal   Collection Time: 06/01/17  4:46 PM  Result Value Ref Range   WBC 5.2 4.6 - 10.2 K/uL   Lymph, poc 1.8 0.6 - 3.4   POC LYMPH PERCENT 35.1 10 - 50 %L   MID (cbc) 0.3 0 - 0.9   POC MID % 5.7 0 - 12 %M   POC Granulocyte 3.1 2 - 6.9   Granulocyte percent 59.2 37 - 80 %G   RBC 4.43 (A) 4.69 - 6.13 M/uL   Hemoglobin 12.1 (A) 14.1 - 18.1 g/dL   HCT, POC 40.937.6 (A) 81.143.5 - 53.7 %   MCV 84.8 80 - 97 fL   MCH, POC 27.3 27 - 31.2 pg   MCHC 32.3 31.8 - 35.4 g/dL   RDW, POC 91.415.0 %   Platelet Count, POC 226 142 - 424 K/uL   MPV 9.0 0 - 99.8 fL  POCT glucose (manual entry)     Status: Abnormal   Collection Time: 06/01/17  4:54 PM  Result Value Ref Range   POC Glucose 170 (A) 70 - 99 mg/dl   CBC Latest Ref Rng & Units 06/01/2017 07/08/2015 12/23/2014  WBC 4.6 - 10.2 K/uL 5.2 5.3 5.4  Hemoglobin 14.1 - 18.1 g/dL 12.1(A) 13.0(A) 11.9(L)  Hematocrit 43.5 - 53.7 % 37.6(A) 38.2(A) 35.8(L)  Platelets 150 - 400 K/uL - - 221   Dg Elbow Complete Right (3+view)  Result Date: 06/01/2017 CLINICAL DATA:  Diffuse right elbow pain since moving furniture few weeks ago. Initial encounter. EXAM: RIGHT ELBOW - COMPLETE 3+ VIEW COMPARISON:  None. FINDINGS: There is no evidence of fracture, dislocation, or joint effusion. There is no evidence of arthropathy or other focal bone abnormality. Soft tissues are unremarkable. IMPRESSION: Normal exam. Electronically Signed   By: Drusilla Kannerhomas  Dalessio M.D.   On: 06/01/2017 16:36   ASSESSMENT AND PLAN:  Jackson Mahoney was seen today for arm pain.  Diagnoses and all orders for this visit:  Right elbow pain: Most likely tendonitis secondary to overuse and now he can not rest due to his occupation.  Arm sling and note  for work stating he must wear the sling and can't use the right arm until cleared in follow up. Compartment syndrome unlikely given excellent pulses. No swelling or palpable cord.  Normal platelets. Sensation intact. I had worried initially about the possibility of acute sickle cell pain given his history of allergy to chloroquine, however he tells me he took this mostly for the [place].  Given his mildly low H&H will check a peripheral smear anyway.  -     DG ELBOW COMPLETE RIGHT (3+VIEW); Future -     POCT CBC -     Uric Acid -     Pathologist smear review -     Creatinine, serum -     POCT glucose (manual entry) -     traMADol (ULTRAM) 50 MG tablet; Take 1 tablet (50 mg total) by mouth every 8 (eight) hours as needed.    The patient is advised to call or return to clinic if he does not see an improvement in symptoms, or to seek the care of the closest emergency department if he worsens with the above plan.   Deliah BostonMichael Dallyn Bergland, MHS, PA-C Primary Care at [facility] 06/01/2017 5:06 PM

## 2017-06-01 NOTE — Patient Instructions (Addendum)
     IF you received an x-ray today, you will receive an invoice from [facility]. Please contact [facility] at [phone] with questions or concerns regarding your invoice.   IF you received labwork today, you will receive an invoice from [facility]. Please contact [facility] at [phone] with questions or concerns regarding your invoice.   Our billing staff will not be able to assist you with questions regarding bills from these companies.  You will be contacted with the lab results as soon as they are available. The fastest way to get your results is to activate your My Chart account. Instructions are located on the last page of this paperwork. If you have not heard from us regarding the results in 2 weeks, please contact this office.     

## 2017-06-04 LAB — CREATININE, SERUM
Creatinine, Ser: 1.78 mg/dL — ABNORMAL HIGH (ref 0.76–1.27)
GFR calc Af Amer: 50 mL/min/{1.73_m2} — ABNORMAL LOW (ref >59)
GFR calc non Af Amer: 43 mL/min/{1.73_m2} — ABNORMAL LOW (ref >59)

## 2017-06-04 LAB — URIC ACID: Uric Acid: 6.5 mg/dL (ref 3.7–8.6)

## 2017-06-06 LAB — PATHOLOGIST SMEAR REVIEW
Basophils Absolute: 0 10*3/uL (ref 0.0–0.2)
Basos: 0 %
EOS (ABSOLUTE): 0.2 10*3/uL (ref 0.0–0.4)
Eos: 5 %
Hematocrit: 35.5 % — ABNORMAL LOW (ref 37.5–51.0)
Hemoglobin: 12 g/dL — ABNORMAL LOW (ref 13.0–17.7)
Immature Grans (Abs): 0 10*3/uL (ref 0.0–0.1)
Immature Granulocytes: 1 %
Lymphocytes Absolute: 1.9 10*3/uL (ref 0.7–3.1)
Lymphs: 42 %
MCH: 28.5 pg (ref 26.6–33.0)
MCHC: 33.8 g/dL (ref 31.5–35.7)
MCV: 84 fL (ref 79–97)
Monocytes Absolute: 0.3 10*3/uL (ref 0.1–0.9)
Monocytes: 6 %
Neutrophils Absolute: 2 10*3/uL (ref 1.4–7.0)
Neutrophils: 46 %
Path Rev PLTs: NORMAL
Path Rev WBC: NORMAL
Platelets: 218 10*3/uL (ref 150–379)
RBC: 4.21 x10E6/uL (ref 4.14–5.80)
RDW: 14.9 % (ref 12.3–15.4)
WBC: 4.5 10*3/uL (ref 3.4–10.8)

## 2017-06-08 ENCOUNTER — Ambulatory Visit: Payer: BLUE CROSS/BLUE SHIELD | Admitting: Physician Assistant

## 2017-06-08 ENCOUNTER — Encounter: Payer: Self-pay | Admitting: Physician Assistant

## 2017-06-08 VITALS — BP 128/80 | HR 87 | Temp 99.0°F | Resp 16 | Ht 71.0 in | Wt 214.0 lb

## 2017-06-08 DIAGNOSIS — M25521 Pain in right elbow: Secondary | ICD-10-CM

## 2017-06-08 NOTE — Progress Notes (Signed)
    06/08/2017 3:40 PM   DOB: [birth year] / MRN: [id number]  SUBJECTIVE:  Jackson Mahoney is a 51 y.o. male presenting for right arm pain. He feels about 40% better with rest. Feels that he does need more time before going back to work.   He is allergic to wasp venom and chloroquine.   He  has a past medical history of Diabetes mellitus without complication (HCC), Hyperlipidemia, and Hypertension.    He  reports that  has never smoked. he has never used smokeless tobacco. He reports that he drinks about 1.2 oz of alcohol per week. He reports that he does not use drugs. He  reports that he does not engage in sexual activity. The patient  has no past surgical history on file.  His family history includes Diabetes in his father.  Review of Systems  Constitutional: Negative for fever.  Gastrointestinal: Negative for nausea.  Neurological: Negative for dizziness.    The problem list and medications were reviewed and updated by myself where necessary and exist elsewhere in the encounter.   OBJECTIVE:  BP 128/80   Pulse 87   Temp 99 F (37.2 C) (Oral)   Resp 16   Ht 5\' 11"  (1.803 m)   Wt 214 lb (97.1 kg)   SpO2 97%   BMI 29.85 kg/m   Physical Exam  Constitutional: He appears well-developed. He is active and cooperative.  Non-toxic appearance.  Cardiovascular: Normal rate.  Pulmonary/Chest: Effort normal. No tachypnea.  Musculoskeletal:  NSeen wearing sling, ROM full.  Pain with full extension of the right elbow.  No bruising or swelling of the joint.   Neurological: He is alert.  Skin: Skin is warm and dry. He is not diaphoretic. No pallor.  Vitals reviewed.   No results found for this or any previous visit (from the past 72 hour(s)).  No results found.  ASSESSMENT AND PLAN:  Jackson Mahoney was seen today for follow-up.  Diagnoses and all orders for this visit:  Right elbow pain: He will continue to rest pending f/u on 06/14/16.     The patient is advised to call or return to  clinic if he does not see an improvement in symptoms, or to seek the care of the closest emergency department if he worsens with the above plan.   Deliah BostonMichael Samika Vetsch, MHS, PA-C Primary Care at [facility] 06/08/2017 3:40 PM

## 2017-06-08 NOTE — Patient Instructions (Signed)
     IF you received an x-ray today, you will receive an invoice from [facility]. Please contact [facility] at [phone] with questions or concerns regarding your invoice.   IF you received labwork today, you will receive an invoice from [facility]. Please contact [facility] at [phone] with questions or concerns regarding your invoice.   Our billing staff will not be able to assist you with questions regarding bills from these companies.  You will be contacted with the lab results as soon as they are available. The fastest way to get your results is to activate your My Chart account. Instructions are located on the last page of this paperwork. If you have not heard from us regarding the results in 2 weeks, please contact this office.     

## 2017-06-14 ENCOUNTER — Other Ambulatory Visit: Payer: Self-pay

## 2017-06-14 ENCOUNTER — Encounter: Payer: Self-pay | Admitting: Physician Assistant

## 2017-06-14 ENCOUNTER — Ambulatory Visit: Payer: BLUE CROSS/BLUE SHIELD | Admitting: Physician Assistant

## 2017-06-14 VITALS — BP 130/84 | HR 104 | Temp 99.0°F | Resp 16 | Ht 71.0 in | Wt 217.8 lb

## 2017-06-14 DIAGNOSIS — E1165 Type 2 diabetes mellitus with hyperglycemia: Secondary | ICD-10-CM | POA: Diagnosis not present

## 2017-06-14 DIAGNOSIS — M25521 Pain in right elbow: Secondary | ICD-10-CM | POA: Diagnosis not present

## 2017-06-14 LAB — POCT GLYCOSYLATED HEMOGLOBIN (HGB A1C): Hemoglobin A1C: 9.2

## 2017-06-14 NOTE — Progress Notes (Signed)
06/14/2017 6:31 PM   DOB: 1965-07-17 / MRN: [id number]  SUBJECTIVE:  Jackson Mahoney is a 52 y.o. male presenting for for recheck of uncontrolled diabetes. He is taking Farxiga, Glipizide, and atrovastatin.  He tells me that he is compliant with his medications. He has been taking his insulin mostly "as needed" and avoids this when his nighttime sugar is at or below 130.    Here for follow up of elbow pain which was exacerbated by his job.  He has been out of work for the last 2 weeks and tells me that he feels ready to return.    He is allergic to wasp venom and chloroquine.   He  has a past medical history of Diabetes mellitus without complication (HCC), Hyperlipidemia, and Hypertension.    He  reports that  has never smoked. he has never used smokeless tobacco. He reports that he drinks about 1.2 oz of alcohol per week. He reports that he does not use drugs. He  reports that he does not engage in sexual activity. The patient  has no past surgical history on file.  His family history includes Diabetes in his father.  Review of Systems  Constitutional: Negative for chills, diaphoresis and fever.  Eyes: Negative.   Respiratory: Negative for cough, hemoptysis, sputum production, shortness of breath and wheezing.   Cardiovascular: Negative for chest pain, orthopnea and leg swelling.  Gastrointestinal: Negative for abdominal pain, blood in stool, constipation, diarrhea, heartburn, melena, nausea and vomiting.  Genitourinary: Negative for dysuria, flank pain, frequency, hematuria and urgency.  Skin: Negative for rash.  Neurological: Negative for dizziness, sensory change, speech change, focal weakness and headaches.    The problem list and medications were reviewed and updated by myself where necessary and exist elsewhere in the encounter.   OBJECTIVE:  BP 130/84   Pulse (!) 104   Temp 99 F (37.2 C)   Resp 16   Ht 5\' 11"  (1.803 m)   Wt 217 lb 12.8 oz (98.8 kg)   SpO2 96%   BMI  30.38 kg/m   Lab Results  Component Value Date   HGBA1C 9.2 06/14/2017     Physical Exam  Constitutional: He appears well-developed. He is active and cooperative.  Non-toxic appearance.  HENT:  Right Ear: Hearing, tympanic membrane, external ear and ear canal normal.  Left Ear: Hearing, tympanic membrane, external ear and ear canal normal.  Nose: Nose normal. Right sinus exhibits no maxillary sinus tenderness and no frontal sinus tenderness. Left sinus exhibits no maxillary sinus tenderness and no frontal sinus tenderness.  Mouth/Throat: Uvula is midline, oropharynx is clear and moist and mucous membranes are normal. No oropharyngeal exudate, posterior oropharyngeal edema or tonsillar abscesses.  Eyes: Conjunctivae are normal. Pupils are equal, round, and reactive to light.  Cardiovascular: Normal rate, regular rhythm, S1 normal, S2 normal, normal heart sounds, intact distal pulses and normal pulses. Exam reveals no gallop and no friction rub.  No murmur heard. Pulmonary/Chest: Effort normal. No tachypnea. He has no rales.  Abdominal: He exhibits no distension.  Musculoskeletal: He exhibits no edema.  Lymphadenopathy:       Head (right side): No submandibular and no tonsillar adenopathy present.       Head (left side): No submandibular and no tonsillar adenopathy present.    He has no cervical adenopathy.  Neurological: He is alert.  Skin: Skin is warm and dry. He is not diaphoretic. No pallor.  Vitals reviewed.   Results for orders placed  or performed in visit on 06/14/17 (from the past 72 hour(s))  POCT glycosylated hemoglobin (Hb A1C)     Status: None   Collection Time: 06/14/17  5:23 PM  Result Value Ref Range   Hemoglobin A1C 9.2     No results found.  ASSESSMENT AND PLAN:  Jackson Mahoney was seen today for follow-up.  Diagnoses and all orders for this visit:  Uncontrolled type 2 diabetes mellitus with hyperglycemia (HCC): Significantly improved from his last check. He is  not taking his insulin consistently.  Lowering insulin to be taken consistently at 10 units nightly. Recs provided via avs.  RTC in one month to provide further reqs.  -     POCT glycosylated hemoglobin (Hb A1C) -     Microalbumin, urine  Right elbow pain: Most likely both lateral and medial epicondylitis. Forearm strap provided here. RTC as needed.  Back to work without restrictions.     The patient is advised to call or return to clinic if he does not see an improvement in symptoms, or to seek the care of the closest emergency department if he worsens with the above plan.   Deliah BostonMichael Clark, MHS, PA-C Primary Care at [facility] 06/14/2017 6:31 PM

## 2017-06-14 NOTE — Patient Instructions (Addendum)
  Take your Marcelline DeistFarxiga and Glipizide exactly as prescribed.  Take 10 units of insulin every night and do not miss doses.  Come back for an a1c recheck and potentially we may be able able to take you off or reduce or insulin.    IF you received an x-ray today, you will receive an invoice from [facility]. Please contact [facility] at [phone] with questions or concerns regarding your invoice.   IF you received labwork today, you will receive an invoice from [facility]. Please contact [facility] at [phone] with questions or concerns regarding your invoice.   Our billing staff will not be able to assist you with questions regarding bills from these companies.  You will be contacted with the lab results as soon as they are available. The fastest way to get your results is to activate your My Chart account. Instructions are located on the last page of this paperwork. If you have not heard from us regarding the results in 2 weeks, please contact this office.

## 2017-06-15 LAB — MICROALBUMIN, URINE: Microalbumin, Urine: 66.9 ug/mL

## 2017-07-06 ENCOUNTER — Other Ambulatory Visit: Payer: Self-pay | Admitting: Family Medicine

## 2017-07-07 NOTE — Telephone Encounter (Signed)
Lab Results  Component Value Date   HGBA1C 9.2 06/14/2017   Pt to follow up in February.  Please notify the patient to bring his sugar readings to that appointment.

## 2017-08-02 ENCOUNTER — Other Ambulatory Visit: Payer: Self-pay | Admitting: Family Medicine

## 2017-09-25 ENCOUNTER — Other Ambulatory Visit: Payer: Self-pay | Admitting: Family Medicine

## 2017-09-25 ENCOUNTER — Other Ambulatory Visit: Payer: Self-pay | Admitting: Physician Assistant

## 2017-10-14 ENCOUNTER — Other Ambulatory Visit: Payer: Self-pay | Admitting: Family Medicine

## 2017-10-14 ENCOUNTER — Telehealth: Payer: Self-pay | Admitting: Family Medicine

## 2017-10-14 NOTE — Telephone Encounter (Signed)
Copied from CRM [id number]. Topic: Inquiry >> Oct 14, 2017  3:07 PM Yvonna Alanis wrote: Reason for CRM: Patient called requesting refills for Insulin Pen Needle (BD PEN NEEDLE NANO U/F) 32G X 4 MM MISC, Atorvastatin (LIPITOR) 40 MG tablet, FARXIGA 10 MG TABS tablet and Lisinopril (PRINIVIL,ZESTRIL) 40 MG tablet. Patient's preferred pharmacy is [facility] [address] [place], [place] - [address] AT [facility] OF [facility] [address] [phone] (Phone)  [phone] (Fax).

## 2017-10-15 NOTE — Telephone Encounter (Signed)
Pt has OV scheduled for 11/18/17 with Dr. Creta Levin. Current prescription states that pt needs OV for further refills.Can pt be given refills on requested medication until seen for appt?  LOV:06/14/17  Atovastatin(Lipitor)  Farxiga  tab Lisinopril(Prinivil,Zestril) 

## 2017-10-15 NOTE — Telephone Encounter (Signed)
Patient needs office visit for further refills of medications. Please schedule for office visit with Dr. Creta Levin at his earliest convenience.

## 2017-10-16 ENCOUNTER — Other Ambulatory Visit: Payer: Self-pay | Admitting: Family Medicine

## 2017-10-16 NOTE — Telephone Encounter (Signed)
Pt has OV scheduled already on 11/18/17---he is just wanting medication to last him until his OV can we not do this for him?

## 2017-10-16 NOTE — Telephone Encounter (Signed)
Medication refused. Multiple fills given and pt fails to schedule OV.

## 2017-11-03 ENCOUNTER — Other Ambulatory Visit: Payer: Self-pay | Admitting: Physician Assistant

## 2017-11-06 ENCOUNTER — Other Ambulatory Visit: Payer: Self-pay | Admitting: Physician Assistant

## 2017-11-07 ENCOUNTER — Telehealth: Payer: Self-pay

## 2017-11-07 NOTE — Telephone Encounter (Signed)
Copied from CRM [id number]. Topic: General - Other >> Nov 06, 2017 12:57 PM Percival Spanish wrote:  Pt has an appt on 11/18/17 and is asking if he  can have enough pills till his appt   Pharmacy [facility]    Pt phone number [phone]   Deliah Boston PA refilled 26 pills until pt can come to appt.   No further refills if pt does not keep appt.

## 2017-11-11 ENCOUNTER — Telehealth: Payer: Self-pay | Admitting: Family Medicine

## 2017-11-11 NOTE — Telephone Encounter (Signed)
Copied from CRM [id number]. Topic: Quick Communication - Rx Refill/Question >> Nov 11, 2017  3:02 PM Jackson Mahoney, Jackson Mahoney, NT wrote: Medication: Assure platinum test strips  Has the patient contacted their pharmacy?yes (Agent: If no, request that the patient contact the pharmacy for the refill.) (Agent: If yes, when and what did the pharmacy advise?)  Preferred Pharmacy (with phone number or street name): [facility] [phone] G[place] Otto- [place], [place] - [id number] AT [facility] [address] [facility] [address] [place] [id number] Phone: [phone] Fax: [phone]    Agent: Please be advised that RX refills may take up to 3 business days. We ask that you follow-up with your pharmacy.

## 2017-11-13 NOTE — Telephone Encounter (Signed)
Will need to know how often patient needs to check glucose- not found in chart notes.

## 2017-11-13 NOTE — Telephone Encounter (Signed)
Denied until f/u visit. Multiple requests and attempts to f/u

## 2017-11-18 ENCOUNTER — Ambulatory Visit: Payer: BLUE CROSS/BLUE SHIELD | Admitting: Family Medicine

## 2017-11-18 NOTE — Progress Notes (Deleted)
No chief complaint on file.   HPI  4 review of systems  Past Medical History:  Diagnosis Date  . Diabetes mellitus without complication ([place])   . Hyperlipidemia   . Hypertension     Current Outpatient Medications  Medication Sig Dispense Refill  . aspirin 81 MG EC tablet Take 81 mg by mouth daily. Swallow whole.    Marland Kitchen atorvastatin (LIPITOR) 40 MG tablet TAKE 1 TABLET(40 MG) BY MOUTH DAILY 30 tablet 0  . FARXIGA 10 MG TABS tablet TAKE 1 TABLET BY MOUTH EVERY DAY 30 tablet 3  . glipiZIDE (GLUCOTROL) 5 MG tablet TAKE 1 TABLET(5 MG) BY MOUTH TWICE DAILY BEFORE A MEAL 26 tablet 0  . glucose monitoring kit (FREESTYLE) monitoring kit 1 each by Does not apply route as needed for other. 1 each 0  . hydrochlorothiazide (HYDRODIURIL) 25 MG tablet Take 1 tablet (25 mg total) by mouth daily. 30 tablet 6  . insulin glargine (LANTUS) 100 UNIT/ML injection Inject 0.2 mLs (20 Units total) into the skin at bedtime. 10 mL 3  . Insulin Pen Needle (BD PEN NEEDLE NANO U/F) 32G X 4 MM MISC USE WITH INSULIN EVERY EVENING 30 each 0  . Insulin Syringe-Needle U-100 (B-D INSULIN SYRINGE) 31G X 5/16" 0.3 ML MISC Use to administer insulin daily at bedtime 60 each 3  . LANTUS SOLOSTAR 100 UNIT/ML Solostar Pen INJECT 20 UNITS INTO THE SKIN DAILY AT 10 PM 30 mL 0  . lisinopril (PRINIVIL,ZESTRIL) 40 MG tablet TAKE 1 TABLET(40 MG) BY MOUTH DAILY 90 tablet 0  . traMADol (ULTRAM) 50 MG tablet Take 1 tablet (50 mg total) by mouth every 8 (eight) hours as needed. 21 tablet 0   No current facility-administered medications for this visit.     Allergies:  Allergies  Allergen Reactions  . Wasp Venom Anaphylaxis  . Chloroquine Itching    No past surgical history on file.  Social History   Socioeconomic History  . Marital status: Single    Spouse name: Not on file  . Number of children: Not on file  . Years of education: Not on file  . Highest education level: Not on file  Occupational History  . Not on file    Social Needs  . Financial resource strain: Not on file  . Food insecurity:    Worry: Not on file    Inability: Not on file  . Transportation needs:    Medical: Not on file    Non-medical: Not on file  Tobacco Use  . Smoking status: Never Smoker  . Smokeless tobacco: Never Used  Substance and Sexual Activity  . Alcohol use: Yes    Alcohol/week: 1.2 oz    Types: 2 Cans of beer per week  . Drug use: No  . Sexual activity: Never  Lifestyle  . Physical activity:    Days per week: Not on file    Minutes per session: Not on file  . Stress: Not on file  Relationships  . Social connections:    Talks on phone: Not on file    Gets together: Not on file    Attends religious service: Not on file    Active member of club or organization: Not on file    Attends meetings of clubs or organizations: Not on file    Relationship status: Not on file  Other Topics Concern  . Not on file  Social History Narrative  . Not on file    Family History  Problem Relation  Age of Onset  . Diabetes Father      ROS Review of Systems See HPI Constitution: No fevers or chills No malaise No diaphoresis Skin: No rash or itching Eyes: no blurry vision, no double vision GU: no dysuria or hematuria Neuro: no dizziness or headaches * all others reviewed and negative   Objective: There were no vitals filed for this visit.  Physical Exam  Assessment and Plan There are no diagnoses linked to this encounter.   [place]

## 2017-11-21 ENCOUNTER — Ambulatory Visit (INDEPENDENT_AMBULATORY_CARE_PROVIDER_SITE_OTHER): Payer: BLUE CROSS/BLUE SHIELD | Admitting: Family Medicine

## 2017-11-21 ENCOUNTER — Other Ambulatory Visit: Payer: Self-pay

## 2017-11-21 ENCOUNTER — Encounter: Payer: Self-pay | Admitting: Family Medicine

## 2017-11-21 VITALS — BP 137/87 | HR 97 | Temp 97.4°F | Resp 18 | Ht 71.0 in | Wt 217.2 lb

## 2017-11-21 DIAGNOSIS — I10 Essential (primary) hypertension: Secondary | ICD-10-CM | POA: Diagnosis not present

## 2017-11-21 DIAGNOSIS — Z23 Encounter for immunization: Secondary | ICD-10-CM | POA: Diagnosis not present

## 2017-11-21 DIAGNOSIS — N182 Chronic kidney disease, stage 2 (mild): Secondary | ICD-10-CM | POA: Diagnosis not present

## 2017-11-21 DIAGNOSIS — E1165 Type 2 diabetes mellitus with hyperglycemia: Secondary | ICD-10-CM

## 2017-11-21 DIAGNOSIS — E785 Hyperlipidemia, unspecified: Secondary | ICD-10-CM

## 2017-11-21 LAB — POCT GLYCOSYLATED HEMOGLOBIN (HGB A1C): Hemoglobin A1C: 11 % — AB (ref 4.0–5.6)

## 2017-11-21 MED ORDER — GLUCOSE BLOOD VI STRP: ORAL_STRIP | 12 refills | Status: DC

## 2017-11-21 MED ORDER — DAPAGLIFLOZIN PROPANEDIOL 10 MG PO TABS: 10.0000 mg | ORAL_TABLET | Freq: Every day | ORAL | 3 refills | Status: DC

## 2017-11-21 NOTE — Telephone Encounter (Signed)
Patient is out and needs the test strips refill today.   Assure Platinum test strips Last OV:11/21/17 ZOX:[id number]:[place] Pharm[facility]: [phone] [place] - [id number] AT [facility] OF [address] [phone] (Phone) [phone] (Fax)

## 2017-11-21 NOTE — Progress Notes (Signed)
Chief Complaint  Patient presents with  . Diabetes    follow up    HPI  Uncontrolled Diabetes Patient reports that he was getting low readings of 60s-90s He reports that when he takes his insulin he gets headaches He reports that he gets hypoglycemic episodes he does not take his farxiga and  Lab Results  Component Value Date   HGBA1C 11.0 (A) 11/21/2017   He reports that he is taking insulin 10 units down from 20 units He states that before going to bed his sugars are in the 130s so he will skip his insulin.   Chronic Renal Insufficiency He also has chronic renal insufficiency due to uncontrolled diabetes He denies NSAID use or alcohol use Lab Results  Component Value Date   CREATININE 1.47 (H) 11/21/2017    Patient reports that he has been exercising He likes to walk at least once a day  He is not lifting the weights Wt Readings from Last 3 Encounters:  11/21/17 217 lb 3.2 oz (98.5 kg)  06/14/17 217 lb 12.8 oz (98.8 kg)  06/08/17 214 lb (97.1 kg)  Body mass index is 30.29 kg/m.  He reports that he has been increasing some of the carbohydrates in his meals He denies any nausea or vomiting   Hypertension: Patient here for follow-up of elevated blood pressure. He is exercising intermittently and is not adherent to low salt diet.  Blood pressure is well controlled at home. Cardiac symptoms none. Patient denies chest pain, chest pressure/discomfort, dyspnea, exertional chest pressure/discomfort, fatigue, irregular heart beat and lower extremity edema.  Cardiovascular risk factors: diabetes mellitus, dyslipidemia, hypertension, male gender and obesity (BMI >= 30 kg/m2). Use of agents associated with hypertension: none. History of target organ damage: none. BP Readings from Last 3 Encounters:  11/21/17 137/87  06/14/17 130/84  06/08/17 128/80   Dyslipidemia: Patient presents for evaluation of lipids.  Compliance with treatment thus far has been good.  A repeat fasting lipid  profile was done.  The patient does not use medications that may worsen dyslipidemias (corticosteroids, progestins, anabolic steroids, diuretics, beta-blockers, amiodarone, cyclosporine, olanzapine). The patient exercises intermittently.  The patient is not known to have coexisting coronary artery disease.   Lab Results  Component Value Date   CHOL 166 11/21/2017   CHOL 162 01/02/2017   CHOL 245 (H) 08/06/2016   Lab Results  Component Value Date   HDL 47 11/21/2017   HDL 37 (L) 01/02/2017   HDL 45 08/06/2016   Lab Results  Component Value Date   LDLCALC 107 (H) 11/21/2017   LDLCALC 111 (H) 01/02/2017   LDLCALC 177 (H) 08/06/2016   Lab Results  Component Value Date   TRIG 59 11/21/2017   TRIG 72 01/02/2017   TRIG 117 08/06/2016   Lab Results  Component Value Date   CHOLHDL 3.5 11/21/2017   CHOLHDL 4.4 01/02/2017   CHOLHDL 5.4 (H) 08/06/2016   No results found for: LDLDIRECT   Past Medical History:  Diagnosis Date  . Diabetes mellitus without complication ([place])   . Hyperlipidemia   . Hypertension     Current Outpatient Medications  Medication Sig Dispense Refill  . aspirin 81 MG EC tablet Take 81 mg by mouth daily. Swallow whole.    Marland Kitchen atorvastatin (LIPITOR) 40 MG tablet TAKE 1 TABLET(40 MG) BY MOUTH DAILY 30 tablet 0  . dapagliflozin propanediol (FARXIGA) 10 MG TABS tablet Take 10 mg by mouth daily. 30 tablet 3  . hydrochlorothiazide (HYDRODIURIL) 25 MG tablet  Take 1 tablet (25 mg total) by mouth daily. 30 tablet 6  . Insulin Pen Needle (BD PEN NEEDLE NANO U/F) 32G X 4 MM MISC USE WITH INSULIN EVERY EVENING 30 each 0  . Insulin Syringe-Needle U-100 (B-D INSULIN SYRINGE) 31G X 5/16" 0.3 ML MISC Use to administer insulin daily at bedtime 60 each 3  . LANTUS SOLOSTAR 100 UNIT/ML Solostar Pen INJECT 20 UNITS INTO THE SKIN DAILY AT 10 PM 30 mL 0  . lisinopril (PRINIVIL,ZESTRIL) 40 MG tablet TAKE 1 TABLET(40 MG) BY MOUTH DAILY 90 tablet 0  . glucose blood test strip Use  as instructed  Diagnosis Code E11.9 100 each 12  . glucose monitoring kit (FREESTYLE) monitoring kit 1 each by Does not apply route as needed for other. 1 each 0  . insulin glargine (LANTUS) 100 UNIT/ML injection Inject 0.2 mLs (20 Units total) into the skin at bedtime. 10 mL 3  . traMADol (ULTRAM) 50 MG tablet Take 1 tablet (50 mg total) by mouth every 8 (eight) hours as needed. (Patient not taking: Reported on 11/21/2017) 21 tablet 0   No current facility-administered medications for this visit.     Allergies:  Allergies  Allergen Reactions  . Wasp Venom Anaphylaxis  . Chloroquine Itching    No past surgical history on file.  Social History   Socioeconomic History  . Marital status: Single    Spouse name: Not on file  . Number of children: Not on file  . Years of education: Not on file  . Highest education level: Not on file  Occupational History  . Not on file  Social Needs  . Financial resource strain: Not on file  . Food insecurity:    Worry: Not on file    Inability: Not on file  . Transportation needs:    Medical: Not on file    Non-medical: Not on file  Tobacco Use  . Smoking status: Never Smoker  . Smokeless tobacco: Never Used  Substance and Sexual Activity  . Alcohol use: Yes    Alcohol/week: 1.2 oz    Types: 2 Cans of beer per week  . Drug use: No  . Sexual activity: Never  Lifestyle  . Physical activity:    Days per week: Not on file    Minutes per session: Not on file  . Stress: Not on file  Relationships  . Social connections:    Talks on phone: Not on file    Gets together: Not on file    Attends religious service: Not on file    Active member of club or organization: Not on file    Attends meetings of clubs or organizations: Not on file    Relationship status: Not on file  Other Topics Concern  . Not on file  Social History Narrative  . Not on file    Family History  Problem Relation Age of Onset  . Diabetes Father      ROS Review  of Systems See HPI Constitution: No fevers or chills No malaise No diaphoresis Skin: No rash or itching Eyes: no blurry vision, no double vision GU: no dysuria or hematuria Neuro: no dizziness or headaches all others reviewed and negative   Objective: Vitals:   11/21/17 1034  BP: 137/87  Pulse: 97  Resp: 18  Temp: (!) 97.4 F (36.3 C)  TempSrc: Oral  SpO2: 97%  Weight: 217 lb 3.2 oz (98.5 kg)  Height: 5' 11"  (1.803 m)    Physical Exam  Constitutional: He is oriented to person, place, and time. He appears well-developed and well-nourished.  HENT:  Head: Normocephalic and atraumatic.  Eyes: Conjunctivae and EOM are normal.  Neck: Normal range of motion. Neck supple. No thyromegaly present.  Cardiovascular: Normal rate, regular rhythm and normal heart sounds.  No murmur heard. Pulmonary/Chest: Effort normal and breath sounds normal. No stridor. No respiratory distress. He has no wheezes.  Abdominal: Soft. Bowel sounds are normal. He exhibits no distension and no mass. There is no tenderness. There is no rebound and no guarding. No hernia.  Musculoskeletal: Normal range of motion. He exhibits no edema.  Neurological: He is alert and oriented to person, place, and time.  Skin: Skin is warm. Capillary refill takes less than 2 seconds.  Psychiatric: He has a normal mood and affect. His behavior is normal. Judgment and thought content normal.    Assessment and Plan Albertus was seen today for diabetes.  He needed medication refills.   Problem List Items Addressed This Visit      Cardiovascular and Mediastinum   Essential hypertension    bp in good range, cpm -  continue current medications - DASH diet reviewed - discussed monitoring schedule for renal function - advised ways to prevent cardiovascular disease        Relevant Orders   Comprehensive metabolic panel (Completed)     Endocrine   Uncontrolled type 2 diabetes mellitus ([place]) - Primary    Resumed meds and  discussed discontinuing the glipizide which has an increased risk of hypoglycemia. Glipizide was initially added due to cost and high deductible insurance plan. Now that he is on [place] will discontinue the glipizide as the hypoglycemia is decreasing his compliance with ALL his medications.      Relevant Medications   dapagliflozin propanediol (FARXIGA) 10 MG TABS tablet   Other Relevant Orders   HM Diabetes Foot Exam (Completed)   POCT glycosylated hemoglobin (Hb A1C) (Completed)   Comprehensive metabolic panel (Completed)     Genitourinary   CRI (chronic renal insufficiency), stage 2 (mild)    Will monitor renal function  Metformin was discontinued due to initial increase in creatinine  Due to uncontrolled diabetes patient has increased renal disease If creatinine continues to increase will refer to Nephrology        Other   Dyslipidemia    Discussed diet and exercise       Relevant Orders   Lipid panel (Completed)   Comprehensive metabolic panel (Completed)    Other Visit Diagnoses    Need for Tdap vaccination       Relevant Orders   Tdap vaccine greater than or equal to 7yo IM (Completed)      A total of 42 minutes were spent face-to-face with the patient during this encounter and over half of that time was spent on counseling and coordination of care. Discussed how to avoid hypoglycemia by adding more protein Discussed strict compliance to determine if the insulin and farxiga are adequate Discussed commitment to close follow-up   [place]

## 2017-11-21 NOTE — Telephone Encounter (Signed)
Pt was in for appt today and thought strips would be sent in. Please send today if possible. Pt has been out and was told he needed appt to be sent in.  [facility] [phone] - [place], [place] - [address] [facility] [address]

## 2017-11-21 NOTE — Patient Instructions (Signed)
     IF you received an x-ray today, you will receive an invoice from [facility]. Please contact [facility] at [phone] with questions or concerns regarding your invoice.   IF you received labwork today, you will receive an invoice from [facility]. Please contact [facility] at [phone] with questions or concerns regarding your invoice.   Our billing staff will not be able to assist you with questions regarding bills from these companies.  You will be contacted with the lab results as soon as they are available. The fastest way to get your results is to activate your My Chart account. Instructions are located on the last page of this paperwork. If you have not heard from us regarding the results in 2 weeks, please contact this office.     

## 2017-11-22 ENCOUNTER — Encounter: Payer: Self-pay | Admitting: Family Medicine

## 2017-11-22 DIAGNOSIS — E785 Hyperlipidemia, unspecified: Secondary | ICD-10-CM | POA: Insufficient documentation

## 2017-11-22 LAB — COMPREHENSIVE METABOLIC PANEL
ALT: 12 IU/L (ref 0–44)
AST: 15 IU/L (ref 0–40)
Albumin/Globulin Ratio: 1.3 (ref 1.2–2.2)
Albumin: 4.1 g/dL (ref 3.5–5.5)
Alkaline Phosphatase: 73 IU/L (ref 39–117)
BUN/Creatinine Ratio: 13 (ref 9–20)
BUN: 19 mg/dL (ref 6–24)
Bilirubin Total: 0.3 mg/dL (ref 0.0–1.2)
CO2: 24 mmol/L (ref 20–29)
Calcium: 9.7 mg/dL (ref 8.7–10.2)
Chloride: 98 mmol/L (ref 96–106)
Creatinine, Ser: 1.47 mg/dL — ABNORMAL HIGH (ref 0.76–1.27)
GFR calc Af Amer: 63 mL/min/{1.73_m2} (ref >59)
GFR calc non Af Amer: 54 mL/min/{1.73_m2} — ABNORMAL LOW (ref >59)
Globulin, Total: 3.2 g/dL (ref 1.5–4.5)
Glucose: 232 mg/dL — ABNORMAL HIGH (ref 65–99)
Potassium: 4.6 mmol/L (ref 3.5–5.2)
Sodium: 135 mmol/L (ref 134–144)
Total Protein: 7.3 g/dL (ref 6.0–8.5)

## 2017-11-22 LAB — LIPID PANEL
Chol/HDL Ratio: 3.5 ratio (ref 0.0–5.0)
Cholesterol, Total: 166 mg/dL (ref 100–199)
HDL: 47 mg/dL (ref >39)
LDL Calculated: 107 mg/dL — ABNORMAL HIGH (ref 0–99)
Triglycerides: 59 mg/dL (ref 0–149)
VLDL Cholesterol Cal: 12 mg/dL (ref 5–40)

## 2017-11-22 NOTE — Assessment & Plan Note (Addendum)
Discussed diet and exercise 

## 2017-11-22 NOTE — Assessment & Plan Note (Signed)
bp in good range, cpm -  continue current medications - DASH diet reviewed - discussed monitoring schedule for renal function - advised ways to prevent cardiovascular disease

## 2017-11-22 NOTE — Assessment & Plan Note (Signed)
Will monitor renal function  Metformin was discontinued due to initial increase in creatinine  Due to uncontrolled diabetes patient has increased renal disease If creatinine continues to increase will refer to Nephrology

## 2017-11-22 NOTE — Assessment & Plan Note (Addendum)
Resumed meds and discussed discontinuing the glipizide which has an increased risk of hypoglycemia. Glipizide was initially added due to cost and high deductible insurance plan. Now that he is on [place] will discontinue the glipizide as the hypoglycemia is decreasing his compliance with ALL his medications.

## 2017-12-08 ENCOUNTER — Other Ambulatory Visit: Payer: Self-pay | Admitting: Physician Assistant

## 2017-12-14 ENCOUNTER — Ambulatory Visit: Payer: BLUE CROSS/BLUE SHIELD | Admitting: Family Medicine

## 2017-12-18 ENCOUNTER — Ambulatory Visit: Payer: BLUE CROSS/BLUE SHIELD | Admitting: Family Medicine

## 2017-12-23 NOTE — Progress Notes (Signed)
Chief Complaint  Patient presents with  . f/u labs    HPI Diabetes- uncontrolled He reports that he is checking his sugars more and notes that his eating is affected by his sugars meaning he eats more when he feels like his sugars are running low.   He states that when he changed his diet and started losing weight he felt better on his medications.  He admits he was not compliant with his diabetes regimen before and often did not stick to his diet. He reports that he now has 2 episodes of hypoglycemia a week.  He denies polyuria, polydipsia, polyphagia. He drinks plenty of water and denies dyrusia or penile discharge.  He does not drink alcohol.  Wt Readings from Last 3 Encounters:  12/25/17 214 lb 6.4 oz (97.3 kg)  11/21/17 217 lb 3.2 oz (98.5 kg)  06/14/17 217 lb 12.8 oz (98.8 kg)    CKD Stage 3 Pt is here to get repeat labs because his last creatinine was abnormal. He does not take ibuprofen or anything that might affect his kidneys. He drinks plenty of water. He recently started exercising.   Component     Latest Ref Rng & Units 11/21/2017  Glucose     65 - 99 mg/dL 232 (H)  BUN     6 - 24 mg/dL 19  Creatinine     0.76 - 1.27 mg/dL 1.47 (H)  GFR, Est Non African American     >59 mL/min/1.73 54 (L)  GFR, Est African American     >59 mL/min/1.73 63  BUN/Creatinine Ratio     9 - 20 13    Essential Hypertension Hypertension: Patient here for follow-up of hypertension. He is exercising and is adherent to low salt diet.  Blood pressure is well controlled at home. Cardiac symptoms none. Patient denies chest pain, chest pressure/discomfort, claudication, dyspnea, exertional chest pressure/discomfort and fatigue.  Cardiovascular risk factors: diabetes mellitus, dyslipidemia, hypertension, male gender and obesity (BMI >= 30 kg/m2). Use of agents associated with hypertension: none. History of target organ damage: none.   BP Readings from Last 3 Encounters:  12/25/17 126/76    11/21/17 137/87  06/14/17 130/84     Past Medical History:  Diagnosis Date  . Diabetes mellitus without complication ([place])   . Hyperlipidemia   . Hypertension     Current Outpatient Medications  Medication Sig Dispense Refill  . aspirin 81 MG EC tablet Take 81 mg by mouth daily. Swallow whole.    Marland Kitchen atorvastatin (LIPITOR) 40 MG tablet TAKE 1 TABLET(40 MG) BY MOUTH DAILY 30 tablet 0  . dapagliflozin propanediol (FARXIGA) 10 MG TABS tablet Take 10 mg by mouth daily. 30 tablet 3  . glucose blood test strip Use as instructed  Diagnosis Code E11.9 100 each 12  . hydrochlorothiazide (HYDRODIURIL) 25 MG tablet Take 1 tablet (25 mg total) by mouth daily. 30 tablet 6  . Insulin Pen Needle (BD PEN NEEDLE NANO U/F) 32G X 4 MM MISC USE WITH INSULIN EVERY EVENING 30 each 0  . Insulin Syringe-Needle U-100 (B-D INSULIN SYRINGE) 31G X 5/16" 0.3 ML MISC Use to administer insulin daily at bedtime 60 each 3  . LANTUS SOLOSTAR 100 UNIT/ML Solostar Pen INJECT 20 UNITS INTO THE SKIN DAILY AT 10 PM 30 mL 0  . lisinopril (PRINIVIL,ZESTRIL) 40 MG tablet TAKE 1 TABLET(40 MG) BY MOUTH DAILY 90 tablet 0  . glucose monitoring kit (FREESTYLE) monitoring kit 1 each by Does not apply route as needed for  other. 1 each 0  . insulin glargine (LANTUS) 100 UNIT/ML injection Inject 0.2 mLs (20 Units total) into the skin at bedtime. 10 mL 3   No current facility-administered medications for this visit.     Allergies:  Allergies  Allergen Reactions  . Wasp Venom Anaphylaxis  . Chloroquine Itching    No past surgical history on file.  Social History   Socioeconomic History  . Marital status: Single    Spouse name: Not on file  . Number of children: Not on file  . Years of education: Not on file  . Highest education level: Not on file  Occupational History  . Not on file  Social Needs  . Financial resource strain: Not on file  . Food insecurity:    Worry: Not on file    Inability: Not on file  .  Transportation needs:    Medical: Not on file    Non-medical: Not on file  Tobacco Use  . Smoking status: Never Smoker  . Smokeless tobacco: Never Used  Substance and Sexual Activity  . Alcohol use: Yes    Alcohol/week: 1.2 oz    Types: 2 Cans of beer per week  . Drug use: No  . Sexual activity: Never  Lifestyle  . Physical activity:    Days per week: Not on file    Minutes per session: Not on file  . Stress: Not on file  Relationships  . Social connections:    Talks on phone: Not on file    Gets together: Not on file    Attends religious service: Not on file    Active member of club or organization: Not on file    Attends meetings of clubs or organizations: Not on file    Relationship status: Not on file  Other Topics Concern  . Not on file  Social History Narrative  . Not on file    Family History  Problem Relation Age of Onset  . Diabetes Father      ROS Review of Systems See HPI Constitution: No fevers or chills No malaise No diaphoresis Skin: No rash or itching Eyes: no blurry vision, no double vision GU: no dysuria or hematuria Neuro: no dizziness or headaches all others reviewed and negative   Objective: Vitals:   12/25/17 0845  BP: 126/76  Pulse: 98  Resp: 17  Temp: 99.1 F (37.3 C)  TempSrc: Oral  SpO2: 98%  Weight: 214 lb 6.4 oz (97.3 kg)  Height: 5' 11"  (1.803 m)  Body mass index is 29.9 kg/m.   Physical Exam  Physical Exam  Constitutional: She is oriented to person, place, and time. She appears well-developed and well-nourished.  HENT:  Head: Normocephalic and atraumatic.  Eyes: Conjunctivae and EOM are normal.  Cardiovascular: Normal rate, regular rhythm and normal heart sounds.   Pulmonary/Chest: Effort normal and breath sounds normal. No respiratory distress. She has no wheezes.  Abdominal: Normal appearance and bowel sounds are normal. There is no tenderness. There is no CVA tenderness.  Neurological: She is alert and oriented  to person, place, and time.    Assessment and Plan Jackson Mahoney was seen today for f/u labs.  Diagnoses and all orders for this visit:  Uncontrolled type 2 diabetes mellitus with hyperglycemia, without long-term current use of insulin (H[facility]) -     HM Diabetes Eye Exam -     POCT glucose (manual entry) -     Cancel: Fructosamine -     Basic metabolic panel  Improving compliance and diet hemoglobin a1c is at next visit Continue exercise On ace On lipitor Emphasized importance of eye and dental exam    Chronic kidney disease (CKD), stage III (moderate) ([place]) -     Basic metabolic panel -  If creatinine <1.5 will continue current regimen If increased will discontinue farxiga and refer to Nephrology Lab Results  Component Value Date   CREATININE 1.48 (H) 12/25/2017    Essential hypertension- Patient's blood pressure is at goal of 139/89 or less. Condition is stable. Continue current medications and treatment plan. I recommend that you exercise for 30-45 minutes 5 days a week. I also recommend a balanced diet with fruits and vegetables every day, lean meats, and little fried foods. The DASH diet (you can find this online) is a good example of this.   Other orders -     Fructosamine     Jackson Mahoney

## 2017-12-25 ENCOUNTER — Other Ambulatory Visit: Payer: Self-pay

## 2017-12-25 ENCOUNTER — Encounter: Payer: Self-pay | Admitting: Family Medicine

## 2017-12-25 ENCOUNTER — Ambulatory Visit: Payer: BLUE CROSS/BLUE SHIELD | Admitting: Family Medicine

## 2017-12-25 VITALS — BP 126/76 | HR 98 | Temp 99.1°F | Resp 17 | Ht 71.0 in | Wt 214.4 lb

## 2017-12-25 DIAGNOSIS — N183 Chronic kidney disease, stage 3 unspecified: Secondary | ICD-10-CM

## 2017-12-25 DIAGNOSIS — Z0000 Encounter for general adult medical examination without abnormal findings: Secondary | ICD-10-CM | POA: Diagnosis not present

## 2017-12-25 DIAGNOSIS — E1165 Type 2 diabetes mellitus with hyperglycemia: Secondary | ICD-10-CM | POA: Diagnosis not present

## 2017-12-25 DIAGNOSIS — Z1211 Encounter for screening for malignant neoplasm of colon: Secondary | ICD-10-CM | POA: Diagnosis not present

## 2017-12-25 DIAGNOSIS — E7849 Other hyperlipidemia: Secondary | ICD-10-CM | POA: Diagnosis not present

## 2017-12-25 DIAGNOSIS — I10 Essential (primary) hypertension: Secondary | ICD-10-CM

## 2017-12-25 LAB — GLUCOSE, POCT (MANUAL RESULT ENTRY): POC Glucose: 208 mg/dl — AB (ref 70–99)

## 2017-12-25 NOTE — Patient Instructions (Addendum)
   IF you received an x-ray today, you will receive an invoice from [facility]. Please contact [facility] at [phone] with questions or concerns regarding your invoice.   IF you received labwork today, you will receive an invoice from [facility]. Please contact [facility] at [phone] with questions or concerns regarding your invoice.   Our billing staff will not be able to assist you with questions regarding bills from these companies.  You will be contacted with the lab results as soon as they are available. The fastest way to get your results is to activate your My Chart account. Instructions are located on the last page of this paperwork. If you have not heard from us regarding the results in 2 weeks, please contact this office.     Diabetes Mellitus and Nutrition When you have diabetes (diabetes mellitus), it is very important to have healthy eating habits because your blood sugar (glucose) levels are greatly affected by what you eat and drink. Eating healthy foods in the appropriate amounts, at about the same times every day, can help you:  Control your blood glucose.  Lower your risk of heart disease.  Improve your blood pressure.  Reach or maintain a healthy weight.  Every person with diabetes is different, and each person has different needs for a meal plan. Your health care provider may recommend that you work with a diet and nutrition specialist (dietitian) to make a meal plan that is best for you. Your meal plan may vary depending on factors such as:  The calories you need.  The medicines you take.  Your weight.  Your blood glucose, blood pressure, and cholesterol levels.  Your activity level.  Other health conditions you have, such as heart or kidney disease.  How do carbohydrates affect me? Carbohydrates affect your blood glucose level more than any other type of food. Eating carbohydrates naturally increases the amount of glucose in your  blood. Carbohydrate counting is a method for keeping track of how many carbohydrates you eat. Counting carbohydrates is important to keep your blood glucose at a healthy level, especially if you use insulin or take certain oral diabetes medicines. It is important to know how many carbohydrates you can safely have in each meal. This is different for every person. Your dietitian can help you calculate how many carbohydrates you should have at each meal and for snack. Foods that contain carbohydrates include:  Bread, cereal, rice, pasta, and crackers.  Potatoes and corn.  Peas, beans, and lentils.  Milk and yogurt.  Fruit and juice.  Desserts, such as cakes, cookies, ice cream, and candy.  How does alcohol affect me? Alcohol can cause a sudden decrease in blood glucose (hypoglycemia), especially if you use insulin or take certain oral diabetes medicines. Hypoglycemia can be a life-threatening condition. Symptoms of hypoglycemia (sleepiness, dizziness, and confusion) are similar to symptoms of having too much alcohol. If your health care provider says that alcohol is safe for you, follow these guidelines:  Limit alcohol intake to no more than 1 drink per day for nonpregnant women and 2 drinks per day for men. One drink equals 12 oz of beer, 5 oz of wine, or 1 oz of hard liquor.  Do not drink on an empty stomach.  Keep yourself hydrated with water, diet soda, or unsweetened iced tea.  Keep in mind that regular soda, juice, and other mixers may contain a lot of sugar and must be counted as carbohydrates.  What are tips for following   this plan? Reading food labels  Start by checking the serving size on the label. The amount of calories, carbohydrates, fats, and other nutrients listed on the label are based on one serving of the food. Many foods contain more than one serving per package.  Check the total grams (g) of carbohydrates in one serving. You can calculate the number of servings of  carbohydrates in one serving by dividing the total carbohydrates by 15. For example, if a food has 30 g of total carbohydrates, it would be equal to 2 servings of carbohydrates.  Check the number of grams (g) of saturated and trans fats in one serving. Choose foods that have low or no amount of these fats.  Check the number of milligrams (mg) of sodium in one serving. Most people should limit total sodium intake to less than 2,300 mg per day.  Always check the nutrition information of foods labeled as "low-fat" or "nonfat". These foods may be higher in added sugar or refined carbohydrates and should be avoided.  Talk to your dietitian to identify your daily goals for nutrients listed on the label. Shopping  Avoid buying canned, premade, or processed foods. These foods tend to be high in fat, sodium, and added sugar.  Shop around the outside edge of the grocery store. This includes fresh fruits and vegetables, bulk grains, fresh meats, and fresh dairy. Cooking  Use low-heat cooking methods, such as baking, instead of high-heat cooking methods like deep frying.  Cook using healthy oils, such as olive, canola, or sunflower oil.  Avoid cooking with butter, cream, or high-fat meats. Meal planning  Eat meals and snacks regularly, preferably at the same times every day. Avoid going long periods of time without eating.  Eat foods high in fiber, such as fresh fruits, vegetables, beans, and whole grains. Talk to your dietitian about how many servings of carbohydrates you can eat at each meal.  Eat 4-6 ounces of lean protein each day, such as lean meat, chicken, fish, eggs, or tofu. 1 ounce is equal to 1 ounce of meat, chicken, or fish, 1 egg, or 1/4 cup of tofu.  Eat some foods each day that contain healthy fats, such as avocado, nuts, seeds, and fish. Lifestyle   Check your blood glucose regularly.  Exercise at least 30 minutes 5 or more days each week, or as told by your health care  provider.  Take medicines as told by your health care provider.  Do not use any products that contain nicotine or tobacco, such as cigarettes and e-cigarettes. If you need help quitting, ask your health care provider.  Work with a counselor or diabetes educator to identify strategies to manage stress and any emotional and social challenges. What are some questions to ask my health care provider?  Do I need to meet with a diabetes educator?  Do I need to meet with a dietitian?  What number can I call if I have questions?  When are the best times to check my blood glucose? Where to find more information:  American Diabetes Association: diabetes.org/food-and-fitness/food  Academy of Nutrition and Dietetics: www.eatright.org/resources/health/diseases-and-conditions/diabetes  National Institute of Diabetes and Digestive and Kidney Diseases (NIH): www.niddk.nih.gov/health-information/diabetes/overview/diet-eating-physical-activity Summary  A healthy meal plan will help you control your blood glucose and maintain a healthy lifestyle.  Working with a diet and nutrition specialist (dietitian) can help you make a meal plan that is best for you.  Keep in mind that carbohydrates and alcohol have immediate effects on your blood glucose   levels. It is important to count carbohydrates and to use alcohol carefully. This information is not intended to replace advice given to you by your health care provider. Make sure you discuss any questions you have with your health care provider. Document Released: 02/22/2005 Document Revised: 07/02/2016 Document Reviewed: 07/02/2016 Elsevier Interactive Patient Education  2018 Elsevier Inc.  

## 2017-12-26 LAB — BASIC METABOLIC PANEL
BUN/Creatinine Ratio: 18 (ref 9–20)
BUN: 26 mg/dL — ABNORMAL HIGH (ref 6–24)
CO2: 21 mmol/L (ref 20–29)
Calcium: 9.6 mg/dL (ref 8.7–10.2)
Chloride: 98 mmol/L (ref 96–106)
Creatinine, Ser: 1.48 mg/dL — ABNORMAL HIGH (ref 0.76–1.27)
GFR calc Af Amer: 62 mL/min/{1.73_m2} (ref >59)
GFR calc non Af Amer: 54 mL/min/{1.73_m2} — ABNORMAL LOW (ref >59)
Glucose: 190 mg/dL — ABNORMAL HIGH (ref 65–99)
Potassium: 5 mmol/L (ref 3.5–5.2)
Sodium: 133 mmol/L — ABNORMAL LOW (ref 134–144)

## 2017-12-26 LAB — FRUCTOSAMINE: Fructosamine: 332 umol/L — ABNORMAL HIGH (ref 0–285)

## 2018-01-10 ENCOUNTER — Encounter: Payer: Self-pay | Admitting: Gastroenterology

## 2018-01-31 ENCOUNTER — Other Ambulatory Visit: Payer: Self-pay | Admitting: Physician Assistant

## 2018-01-31 NOTE — Telephone Encounter (Signed)
Patient called and advised of the provider recommendation of a follow up  appointment for his diabetes, hypertension. He agrees to an appointment. The earliest available day is Tuesday, 02/25/18 at 1040, appointment scheduled.

## 2018-02-24 ENCOUNTER — Encounter: Payer: Self-pay | Admitting: Gastroenterology

## 2018-02-24 ENCOUNTER — Ambulatory Visit (AMBULATORY_SURGERY_CENTER): Payer: Self-pay | Admitting: *Deleted

## 2018-02-24 VITALS — Ht 71.0 in | Wt 190.0 lb

## 2018-02-24 DIAGNOSIS — Z1211 Encounter for screening for malignant neoplasm of colon: Secondary | ICD-10-CM

## 2018-02-24 MED ORDER — NA SULFATE-K SULFATE-MG SULF 17.5-3.13-1.6 GM/177ML PO SOLN: 1.0000 | Freq: Once | ORAL | 0 refills | Status: AC

## 2018-02-24 NOTE — Progress Notes (Deleted)
No chief complaint on file.   HPI  4 review of systems  Past Medical History:  Diagnosis Date  . Allergy   . Diabetes mellitus without complication ([place])   . Hyperlipidemia   . Hypertension     Current Outpatient Medications  Medication Sig Dispense Refill  . ACCU-CHEK FASTCLIX LANCETS MISC USE UTD  0  . aspirin 81 MG EC tablet Take 81 mg by mouth daily. Swallow whole.    Marland Kitchen atorvastatin (LIPITOR) 40 MG tablet TAKE 1 TABLET(40 MG) BY MOUTH DAILY (Patient not taking: Reported on 02/24/2018) 30 tablet 0  . dapagliflozin propanediol (FARXIGA) 10 MG TABS tablet Take 10 mg by mouth daily. (Patient not taking: Reported on 02/24/2018) 30 tablet 3  . famciclovir (FAMVIR) 500 MG tablet TK 1 T PO BID FOR 5 DAYS  5  . glucose blood test strip Use as instructed  Diagnosis Code E11.9 100 each 12  . glucose monitoring kit (FREESTYLE) monitoring kit 1 each by Does not apply route as needed for other. 1 each 0  . hydrochlorothiazide (HYDRODIURIL) 25 MG tablet Take 1 tablet (25 mg total) by mouth daily. (Patient not taking: Reported on 02/24/2018) 30 tablet 6  . insulin glargine (LANTUS) 100 UNIT/ML injection Inject 0.2 mLs (20 Units total) into the skin at bedtime. 10 mL 3  . Insulin Pen Needle (BD PEN NEEDLE NANO U/F) 32G X 4 MM MISC USE WITH INSULIN EVERY EVENING (Patient not taking: Reported on 02/24/2018) 30 each 0  . Insulin Syringe-Needle U-100 (B-D INSULIN SYRINGE) 31G X 5/16" 0.3 ML MISC Use to administer insulin daily at bedtime (Patient not taking: Reported on 02/24/2018) 60 each 3  . LANTUS SOLOSTAR 100 UNIT/ML Solostar Pen INJECT 20 UNITS INTO THE SKIN DAILY AT 10 PM (Patient not taking: Reported on 02/24/2018) 30 mL 0  . lisinopril (PRINIVIL,ZESTRIL) 40 MG tablet TAKE 1 TABLET(40 MG) BY MOUTH DAILY (Patient not taking: Reported on 02/24/2018) 30 tablet 0  . metFORMIN (GLUCOPHAGE) 500 MG tablet TK 1 T PO D  4  . Na Sulfate-K Sulfate-Mg Sulf (SUPREP BOWEL PREP KIT) 17.5-3.13-1.6 GM/177ML SOLN  Take 1 kit by mouth once for 1 dose. suprep as directed. No substitutions 354 mL 0  . zolpidem (AMBIEN) 10 MG tablet TK 1 T PO HS PRN  2   No current facility-administered medications for this visit.     Allergies:  Allergies  Allergen Reactions  . Wasp Venom Anaphylaxis  . Chloroquine Itching    Past Surgical History:  Procedure Laterality Date  . NO PAST SURGERIES      Social History   Socioeconomic History  . Marital status: Single    Spouse name: Not on file  . Number of children: Not on file  . Years of education: Not on file  . Highest education level: Not on file  Occupational History  . Not on file  Social Needs  . Financial resource strain: Not on file  . Food insecurity:    Worry: Not on file    Inability: Not on file  . Transportation needs:    Medical: Not on file    Non-medical: Not on file  Tobacco Use  . Smoking status: Never Smoker  . Smokeless tobacco: Never Used  Substance and Sexual Activity  . Alcohol use: Yes    Alcohol/week: 2.0 standard drinks    Types: 2 Cans of beer per week  . Drug use: No  . Sexual activity: Never  Lifestyle  . Physical activity:  Days per week: Not on file    Minutes per session: Not on file  . Stress: Not on file  Relationships  . Social connections:    Talks on phone: Not on file    Gets together: Not on file    Attends religious service: Not on file    Active member of club or organization: Not on file    Attends meetings of clubs or organizations: Not on file    Relationship status: Not on file  Other Topics Concern  . Not on file  Social History Narrative  . Not on file    Family History  Problem Relation Age of Onset  . Diabetes Father   . Colon cancer Neg Hx   . Colon polyps Neg Hx   . Rectal cancer Neg Hx   . Stomach cancer Neg Hx   . Esophageal cancer Neg Hx      ROS Review of Systems See HPI Constitution: No fevers or chills No malaise No diaphoresis Skin: No rash or  itching Eyes: no blurry vision, no double vision GU: no dysuria or hematuria Neuro: no dizziness or headaches * all others reviewed and negative   Objective: There were no vitals filed for this visit.  Physical Exam  Assessment and Plan There are no diagnoses linked to this encounter.   Delores P [employer]

## 2018-02-24 NOTE — Progress Notes (Signed)
No egg or soy allergy known to patient  No issues with past sedation with any surgeries  or procedures, no intubation problems  No diet pills per patient No home 02 use per patient  No blood thinners per patient  Pt denies issues with constipation  No A fib or A flutter  EMMI video sent to pt's e mail  Pay no more than $50 coupon to pt in PV today

## 2018-02-25 ENCOUNTER — Ambulatory Visit: Payer: Self-pay | Admitting: Family Medicine

## 2018-03-04 ENCOUNTER — Ambulatory Visit (AMBULATORY_SURGERY_CENTER): Payer: BLUE CROSS/BLUE SHIELD | Admitting: Gastroenterology

## 2018-03-04 ENCOUNTER — Encounter: Payer: Self-pay | Admitting: Gastroenterology

## 2018-03-04 VITALS — BP 117/77 | HR 74 | Temp 97.5°F | Resp 13 | Ht 71.0 in | Wt 190.0 lb

## 2018-03-04 DIAGNOSIS — Z1211 Encounter for screening for malignant neoplasm of colon: Secondary | ICD-10-CM | POA: Diagnosis not present

## 2018-03-04 DIAGNOSIS — K640 First degree hemorrhoids: Secondary | ICD-10-CM

## 2018-03-04 MED ORDER — SODIUM CHLORIDE 0.9 % IV SOLN: 500.0000 mL | Freq: Once | INTRAVENOUS | Status: DC

## 2018-03-04 NOTE — Progress Notes (Signed)
Pt's states no medical or surgical changes since previsit or office visit. 

## 2018-03-04 NOTE — Progress Notes (Signed)
To PACU VSS. Report to [email or web address] 

## 2018-03-04 NOTE — Patient Instructions (Signed)
HANDOUTS GIVEN FOR HEMORRHOIDS AND HEMORRHOID BANDING  YOU HAD AN ENDOSCOPIC PROCEDURE TODAY AT THE [facility]:   Refer to the procedure report that was given to you for any specific questions about what was found during the examination.  If the procedure report does not answer your questions, please call your gastroenterologist to clarify.  If you requested that your care partner not be given the details of your procedure findings, then the procedure report has been included in a sealed envelope for you to review at your convenience later.  YOU SHOULD EXPECT: Some feelings of bloating in the abdomen. Passage of more gas than usual.  Walking can help get rid of the air that was put into your GI tract during the procedure and reduce the bloating. If you had a lower endoscopy (such as a colonoscopy or flexible sigmoidoscopy) you may notice spotting of blood in your stool or on the toilet paper. If you underwent a bowel prep for your procedure, you may not have a normal bowel movement for a few days.  Please Note:  You might notice some irritation and congestion in your nose or some drainage.  This is from the oxygen used during your procedure.  There is no need for concern and it should clear up in a day or so.  SYMPTOMS TO REPORT IMMEDIATELY:   Following lower endoscopy (colonoscopy or flexible sigmoidoscopy):  Excessive amounts of blood in the stool  Significant tenderness or worsening of abdominal pains  Swelling of the abdomen that is new, acute  Fever of 100F or higher  For urgent or emergent issues, a gastroenterologist can be reached at any hour by calling [phone].   DIET:  We do recommend a small meal at first, but then you may proceed to your regular diet.  Drink plenty of fluids but you should avoid alcoholic beverages for 24 hours.  ACTIVITY:  You should plan to take it easy for the rest of today and you should NOT DRIVE or use heavy machinery until tomorrow  (because of the sedation medicines used during the test).    FOLLOW UP: Our staff will call the number listed on your records the next business day following your procedure to check on you and address any questions or concerns that you may have regarding the information given to you following your procedure. If we do not reach you, we will leave a message.  However, if you are feeling well and you are not experiencing any problems, there is no need to return our call.  We will assume that you have returned to your regular daily activities without incident.  If any biopsies were taken you will be contacted by phone or by letter within the next 1-3 weeks.  Please call us at [phone] if you have not heard about the biopsies in 3 weeks.    SIGNATURES/CONFIDENTIALITY: You and/or your care partner have signed paperwork which will be entered into your electronic medical record.  These signatures attest to the fact that that the information above on your After Visit Summary has been reviewed and is understood.  Full responsibility of the confidentiality of this discharge information lies with you and/or your care-partner.

## 2018-03-04 NOTE — Op Note (Signed)
[facility] Patient Name: Jackson Mahoney Procedure Date: 03/04/2018 11:37 AM MRN: [id number] Endoscopist: Doristine LocksVito Cirigliano , MD Age: 52 Referring MD:  Date of Birth: Feb 06, 1966 Gender: Male Account #: [id number] Procedure:                Colonoscopy Indications:              Screening for colorectal malignant neoplasm, This                            is the patient's first colonoscopy Medicines:                Monitored Anesthesia Care Procedure:                Pre-Anesthesia Assessment:                           - Prior to the procedure, a History and Physical                            was performed, and patient medications and                            allergies were reviewed. The patient's tolerance of                            previous anesthesia was also reviewed. The risks                            and benefits of the procedure and the sedation                            options and risks were discussed with the patient.                            All questions were answered, and informed consent                            was obtained. Prior Anticoagulants: The patient has                            taken no previous anticoagulant or antiplatelet                            agents. ASA Grade Assessment: II - A patient with                            mild systemic disease. After reviewing the risks                            and benefits, the patient was deemed in                            satisfactory condition to undergo the procedure.  After obtaining informed consent, the colonoscope                            was passed under direct vision. Throughout the                            procedure, the patient's blood pressure, pulse, and                            oxygen saturations were monitored continuously. The                            Colonoscope was introduced through the anus and                            advanced to the the terminal  ileum. The colonoscopy                            was performed without difficulty. The patient                            tolerated the procedure well. The quality of the                            bowel preparation was adequate. Scope In: 11:43:53 AM Scope Out: 11:57:11 AM Scope Withdrawal Time: 0 hours 10 minutes 25 seconds  Total Procedure Duration: 0 hours 13 minutes 18 seconds  Findings:                 The perianal and digital rectal examinations were                            normal.                           Non-bleeding internal hemorrhoids were found during                            retroflexion. The hemorrhoids were small.                           The exam was normal throughout the colon.                           The terminal ileum appeared normal. Complications:            No immediate complications. Estimated Blood Loss:     Estimated blood loss: none. Impression:               - Non-bleeding internal hemorrhoids.                           - The examined portion of the ileum was normal.                           - No specimens collected. Recommendation:           -  Patient has a contact number available for                            emergencies. The signs and symptoms of potential                            delayed complications were discussed with the                            patient. Return to normal activities tomorrow.                            Written discharge instructions were provided to the                            patient.                           - Resume previous diet today.                           - Continue present medications.                           - Repeat colonoscopy in 10 years for screening                            purposes.                           - Internal hemorrhoids were noted on this study and                            may be amenable to hemorrhoid band ligation. If you                            are interested in further  treatment of these                            hemorrhoids with band ligation, please contact my                            clinic to set up an appointment for evaluation and                            treatment. Doristine Locks, MD 03/04/2018 12:07:38 PM

## 2018-03-05 ENCOUNTER — Telehealth: Payer: Self-pay

## 2018-03-05 NOTE — Telephone Encounter (Signed)
  Follow up Call-  Call back number 03/04/2018  Post procedure Call Back phone  # [phone]  Permission to leave phone message Yes  Some recent data might be hidden     Patient questions:  Do you have a fever, pain , or abdominal swelling? No. Pain Score  0 *  Have you tolerated food without any problems? Yes.    Have you been able to return to your normal activities? Yes.    Do you have any questions about your discharge instructions: Diet   No. Medications  No. Follow up visit  No.  Do you have questions or concerns about your Care? No.  Actions: * If pain score is 4 or above: No action needed, pain <4.

## 2018-03-17 ENCOUNTER — Other Ambulatory Visit: Payer: Self-pay | Admitting: Family Medicine

## 2018-03-18 NOTE — Telephone Encounter (Signed)
Requested Prescriptions  Pending Prescriptions Disp Refills  . lisinopril (PRINIVIL,ZESTRIL) 40 MG tablet [Pharmacy Med Name: LISINOPRIL 40MG  TABLETS] 30 tablet 0    Sig: TAKE 1 TABLET BY MOUTH EVERY DAY     Cardiovascular:  ACE Inhibitors Failed - 03/17/2018  4:03 PM      Failed - Cr in normal range and within 180 days    Creat  Date Value Ref Range Status  07/08/2015 1.15 0.60 - 1.35 mg/dL Final   Creatinine, Ser  Date Value Ref Range Status  12/25/2017 1.48 (H) 0.76 - 1.27 mg/dL Final         Passed - K in normal range and within 180 days    Potassium  Date Value Ref Range Status  12/25/2017 5.0 3.5 - 5.2 mmol/L Final         Passed - Patient is not pregnant      Passed - Last BP in normal range    BP Readings from Last 1 Encounters:  03/04/18 117/77         Passed - Valid encounter within last 6 months    Recent Outpatient Visits          2 months ago Uncontrolled type 2 diabetes mellitus with hyperglycemia, without long-term current use of insulin (HCC)   [facility], Zoe A, MD   3 months ago Uncontrolled type 2 diabetes mellitus with hyperglycemia (HCC)   Primary Care at [facility], Zoe A, MD   9 months ago Uncontrolled type 2 diabetes mellitus with hyperglycemia [facility])   [facility], Marolyn Hammock, PA-C   9 months ago Right elbow pain   [facility], Marolyn Hammock, PA-C   9 months ago Right elbow pain   [facility], Marolyn Hammock, PA-C

## 2018-03-26 DIAGNOSIS — K649 Unspecified hemorrhoids: Secondary | ICD-10-CM | POA: Diagnosis not present

## 2018-03-26 DIAGNOSIS — E7849 Other hyperlipidemia: Secondary | ICD-10-CM | POA: Diagnosis not present

## 2018-03-26 DIAGNOSIS — Z23 Encounter for immunization: Secondary | ICD-10-CM | POA: Diagnosis not present

## 2018-03-26 DIAGNOSIS — G4700 Insomnia, unspecified: Secondary | ICD-10-CM | POA: Diagnosis not present

## 2018-03-26 DIAGNOSIS — E1165 Type 2 diabetes mellitus with hyperglycemia: Secondary | ICD-10-CM | POA: Diagnosis not present

## 2018-04-04 DIAGNOSIS — Z23 Encounter for immunization: Secondary | ICD-10-CM | POA: Diagnosis not present

## 2018-04-22 ENCOUNTER — Other Ambulatory Visit: Payer: Self-pay | Admitting: Physician Assistant

## 2018-05-12 DIAGNOSIS — E119 Type 2 diabetes mellitus without complications: Secondary | ICD-10-CM | POA: Diagnosis not present

## 2018-05-12 DIAGNOSIS — G4700 Insomnia, unspecified: Secondary | ICD-10-CM | POA: Diagnosis not present

## 2018-05-12 DIAGNOSIS — E7849 Other hyperlipidemia: Secondary | ICD-10-CM | POA: Diagnosis not present

## 2018-05-12 DIAGNOSIS — A609 Anogenital herpesviral infection, unspecified: Secondary | ICD-10-CM | POA: Diagnosis not present

## 2018-06-29 ENCOUNTER — Other Ambulatory Visit: Payer: Self-pay | Admitting: Physician Assistant

## 2018-08-11 DIAGNOSIS — G4700 Insomnia, unspecified: Secondary | ICD-10-CM | POA: Diagnosis not present

## 2018-08-11 DIAGNOSIS — Z111 Encounter for screening for respiratory tuberculosis: Secondary | ICD-10-CM | POA: Diagnosis not present

## 2018-08-11 DIAGNOSIS — E119 Type 2 diabetes mellitus without complications: Secondary | ICD-10-CM | POA: Diagnosis not present

## 2018-08-11 DIAGNOSIS — E7849 Other hyperlipidemia: Secondary | ICD-10-CM | POA: Diagnosis not present

## 2018-08-23 ENCOUNTER — Other Ambulatory Visit: Payer: Self-pay | Admitting: Family Medicine

## 2018-08-25 ENCOUNTER — Other Ambulatory Visit: Payer: Self-pay | Admitting: Family Medicine

## 2018-08-25 NOTE — Telephone Encounter (Signed)
Requested medication (s) are due for refill today: yes  Requested medication (s) are on the active medication list: yes  Last refill:  Lantus solostar pen 04/25/2018 and lisinopril 40 mg 03/18/2018 for 90 tabs  Future visit scheduled: yes 10/17/2018  Notes to clinic:  Diabetes - insulins failed, HBA1C failed. Cardiovascular : ACE inhibitors failed.  Requested Prescriptions  Pending Prescriptions Disp Refills   LANTUS SOLOSTAR 100 UNIT/ML Solostar Pen [Pharmacy Med Name: LANTUS SOLOSTAR PEN INJ 3ML] 30 mL 0    Sig: INJECT 20 UNITS INTO THE SKIN DAILY AT 10 PM     Endocrinology:  Diabetes - Insulins Failed - 08/23/2018  3:38 PM      Failed - HBA1C is between 0 and 7.9 and within 180 days    Hemoglobin A1C  Date Value Ref Range Status  11/21/2017 11.0 (A) 4.0 - 5.6 % Final         Failed - Valid encounter within last 6 months    Recent Outpatient Visits          8 months ago Uncontrolled type 2 diabetes mellitus with hyperglycemia, without long-term current use of insulin (HCC)   [facility] at [facility], Zoe A, MD   9 months ago Uncontrolled type 2 diabetes mellitus with hyperglycemia (HCC)   Primary Care at [facility], Zoe A, MD   1 year ago Uncontrolled type 2 diabetes mellitus with hyperglycemia (HCC)   [facility], Marolyn Hammock, PA-C   1 year ago Right elbow pain   [facility], Marolyn Hammock, PA-C   1 year ago Right elbow pain   [facility], Marolyn Hammock, PA-C      Future Appointments            In 1 month Doristine Bosworth, MD [facility] at [place], [facility]          lisinopril (PRINIVIL,ZESTRIL) 40 MG tablet [Pharmacy Med Name: LISINOPRIL 40MG  TABLETS] 90 tablet 0    Sig: TAKE 1 TABLET BY MOUTH EVERY DAY     Cardiovascular:  ACE Inhibitors Failed - 08/23/2018  3:38 PM      Failed - Cr in normal range and within 180 days    Creat  Date Value Ref Range Status  07/08/2015 1.15 0.60 - 1.35 mg/dL Final   Creatinine,  Ser  Date Value Ref Range Status  12/25/2017 1.48 (H) 0.76 - 1.27 mg/dL Final         Failed - K in normal range and within 180 days    Potassium  Date Value Ref Range Status  12/25/2017 5.0 3.5 - 5.2 mmol/L Final         Failed - Valid encounter within last 6 months    Recent Outpatient Visits          8 months ago Uncontrolled type 2 diabetes mellitus with hyperglycemia, without long-term current use of insulin (HCC)   Primary Care at [facility], Zoe A, MD   9 months ago Uncontrolled type 2 diabetes mellitus with hyperglycemia [facility])   [facility], Zoe A, MD   1 year ago Uncontrolled type 2 diabetes mellitus with hyperglycemia [facility])   [facility], Marolyn Hammock, PA-C   1 year ago Right elbow pain   [facility], Marolyn Hammock, PA-C   1 year ago Right elbow pain   [facility] at [facility], Marolyn Hammock, PA-C  Future Appointments            In 1 month Doristine Bosworth, MD Primary Care at [place], [facility]           Passed - Patient is not pregnant      Passed - Last BP in normal range    BP Readings from Last 1 Encounters:  03/04/18 117/77

## 2018-10-10 ENCOUNTER — Other Ambulatory Visit: Payer: Self-pay

## 2018-10-10 DIAGNOSIS — E1165 Type 2 diabetes mellitus with hyperglycemia: Secondary | ICD-10-CM

## 2018-10-10 DIAGNOSIS — Z114 Encounter for screening for human immunodeficiency virus [HIV]: Secondary | ICD-10-CM

## 2018-10-17 ENCOUNTER — Ambulatory Visit: Payer: BLUE CROSS/BLUE SHIELD | Admitting: Family Medicine

## 2018-10-17 ENCOUNTER — Other Ambulatory Visit: Payer: Self-pay

## 2018-10-27 ENCOUNTER — Other Ambulatory Visit: Payer: Self-pay

## 2018-10-27 ENCOUNTER — Other Ambulatory Visit: Payer: Self-pay | Admitting: Family Medicine

## 2018-10-27 ENCOUNTER — Ambulatory Visit (INDEPENDENT_AMBULATORY_CARE_PROVIDER_SITE_OTHER): Payer: BLUE CROSS/BLUE SHIELD | Admitting: Family Medicine

## 2018-10-27 VITALS — BP 130/84 | HR 112 | Temp 98.6°F | Resp 16 | Ht 71.0 in | Wt 208.0 lb

## 2018-10-27 DIAGNOSIS — E785 Hyperlipidemia, unspecified: Secondary | ICD-10-CM

## 2018-10-27 DIAGNOSIS — I10 Essential (primary) hypertension: Secondary | ICD-10-CM | POA: Diagnosis not present

## 2018-10-27 DIAGNOSIS — E1165 Type 2 diabetes mellitus with hyperglycemia: Secondary | ICD-10-CM | POA: Diagnosis not present

## 2018-10-27 DIAGNOSIS — Z9119 Patient's noncompliance with other medical treatment and regimen: Secondary | ICD-10-CM

## 2018-10-27 DIAGNOSIS — Z91199 Patient's noncompliance with other medical treatment and regimen due to unspecified reason: Secondary | ICD-10-CM

## 2018-10-27 LAB — POCT GLYCOSYLATED HEMOGLOBIN (HGB A1C): Hemoglobin A1C: 14 % — AB (ref 4.0–5.6)

## 2018-10-27 MED ORDER — METFORMIN HCL 500 MG PO TABS: 500.0000 mg | ORAL_TABLET | Freq: Three times a day (TID) | ORAL | 0 refills | Status: DC

## 2018-10-27 NOTE — Patient Instructions (Addendum)
Continue aspirin daily, atrovastatin daily, lisinopril 40mg  daily  At bedtime take lantus 20 units around 8pm In the morning eat protein such as any meat, avocado, oxtails (nothing fried, nothing starchy) Drink water or coffee or almond milk  Lunch chicken salad or grilled chicken sandwich at lunch or just some vegetables with ranch dressing  Dinner should be small like a bowl of oatmeal or (corn or broccoli or spinach or bok choi or cabbage with meat)  Take your metformin with each meal Take farxiga with breakfast   I will call you in 2 weeks to get your numbers Follow up with Endocrinology when you are contacted     If you have lab work done today you will be contacted with your lab results within the next 2 weeks.  If you have not heard from [place] then please contact us. The fastest way to get your results is to register for My Chart.   IF you received an x-ray today, you will receive an invoice from [facility]. Please contact [facility] at [phone] with questions or concerns regarding your invoice.   IF you received labwork today, you will receive an invoice from [facility]. Please contact [facility] at [phone] with questions or concerns regarding your invoice.   Our billing staff will not be able to assist you with questions regarding bills from these companies.  You will be contacted with the lab results as soon as they are available. The fastest way to get your results is to activate your My Chart account. Instructions are located on the last page of this paperwork. If you have not heard from [place] regarding the results in 2 weeks, please contact this office.     Diabetes Mellitus and Nutrition, Adult When you have diabetes (diabetes mellitus), it is very important to have healthy eating habits because your blood sugar (glucose) levels are greatly affected by what you eat and drink. Eating healthy foods in the appropriate amounts, at about the same times  every day, can help you:  Control your blood glucose.  Lower your risk of heart disease.  Improve your blood pressure.  Reach or maintain a healthy weight. Every person with diabetes is different, and each person has different needs for a meal plan. Your health care provider may recommend that you work with a diet and nutrition specialist (dietitian) to make a meal plan that is best for you. Your meal plan may vary depending on factors such as:  The calories you need.  The medicines you take.  Your weight.  Your blood glucose, blood pressure, and cholesterol levels.  Your activity level.  Other health conditions you have, such as heart or kidney disease. How do carbohydrates affect me? Carbohydrates, also called carbs, affect your blood glucose level more than any other type of food. Eating carbs naturally raises the amount of glucose in your blood. Carb counting is a method for keeping track of how many carbs you eat. Counting carbs is important to keep your blood glucose at a healthy level, especially if you use insulin or take certain oral diabetes medicines. It is important to know how many carbs you can safely have in each meal. This is different for every person. Your dietitian can help you calculate how many carbs you should have at each meal and for each snack. Foods that contain carbs include:  Bread, cereal, rice, pasta, and crackers.  Potatoes and corn.  Peas, beans, and lentils.  Milk and yogurt.  Fruit and juice.  Desserts, such as cakes, cookies, ice cream, and candy. How does alcohol affect me? Alcohol can cause a sudden decrease in blood glucose (hypoglycemia), especially if you use insulin or take certain oral diabetes medicines. Hypoglycemia can be a life-threatening condition. Symptoms of hypoglycemia (sleepiness, dizziness, and confusion) are similar to symptoms of having too much alcohol. If your health care provider says that alcohol is safe for you,  follow these guidelines:  Limit alcohol intake to no more than 1 drink per day for nonpregnant women and 2 drinks per day for men. One drink equals 12 oz of beer, 5 oz of wine, or 1 oz of hard liquor.  Do not drink on an empty stomach.  Keep yourself hydrated with water, diet soda, or unsweetened iced tea.  Keep in mind that regular soda, juice, and other mixers may contain a lot of sugar and must be counted as carbs. What are tips for following this plan?  Reading food labels  Start by checking the serving size on the "Nutrition Facts" label of packaged foods and drinks. The amount of calories, carbs, fats, and other nutrients listed on the label is based on one serving of the item. Many items contain more than one serving per package.  Check the total grams (g) of carbs in one serving. You can calculate the number of servings of carbs in one serving by dividing the total carbs by 15. For example, if a food has 30 g of total carbs, it would be equal to 2 servings of carbs.  Check the number of grams (g) of saturated and trans fats in one serving. Choose foods that have low or no amount of these fats.  Check the number of milligrams (mg) of salt (sodium) in one serving. Most people should limit total sodium intake to less than 2,300 mg per day.  Always check the nutrition information of foods labeled as "low-fat" or "nonfat". These foods may be higher in added sugar or refined carbs and should be avoided.  Talk to your dietitian to identify your daily goals for nutrients listed on the label. Shopping  Avoid buying canned, premade, or processed foods. These foods tend to be high in fat, sodium, and added sugar.  Shop around the outside edge of the grocery store. This includes fresh fruits and vegetables, bulk grains, fresh meats, and fresh dairy. Cooking  Use low-heat cooking methods, such as baking, instead of high-heat cooking methods like deep frying.  Cook using healthy oils,  such as olive, canola, or sunflower oil.  Avoid cooking with butter, cream, or high-fat meats. Meal planning  Eat meals and snacks regularly, preferably at the same times every day. Avoid going long periods of time without eating.  Eat foods high in fiber, such as fresh fruits, vegetables, beans, and whole grains. Talk to your dietitian about how many servings of carbs you can eat at each meal.  Eat 4-6 ounces (oz) of lean protein each day, such as lean meat, chicken, fish, eggs, or tofu. One oz of lean protein is equal to: ? 1 oz of meat, chicken, or fish. ? 1 egg. ?  cup of tofu.  Eat some foods each day that contain healthy fats, such as avocado, nuts, seeds, and fish. Lifestyle  Check your blood glucose regularly.  Exercise regularly as told by your health care provider. This may include: ? 150 minutes of moderate-intensity or vigorous-intensity exercise each week. This could be brisk walking, biking, or water aerobics. ? Stretching and doing  strength exercises, such as yoga or weightlifting, at least 2 times a week.  Take medicines as told by your health care provider.  Do not use any products that contain nicotine or tobacco, such as cigarettes and e-cigarettes. If you need help quitting, ask your health care provider.  Work with a Veterinary surgeoncounselor or diabetes educator to identify strategies to manage stress and any emotional and social challenges. Questions to ask a health care provider  Do I need to meet with a diabetes educator?  Do I need to meet with a dietitian?  What number can I call if I have questions?  When are the best times to check my blood glucose? Where to find more information:  American Diabetes Association: diabetes.org  Academy of Nutrition and Dietetics: www.eatright.[employer]  National Institute of Diabetes and Digestive and Kidney Diseases (NIH): CarFlippers.tnwww.niddk.nih.gov Summary  A healthy meal plan will help you control your blood glucose and maintain a  healthy lifestyle.  Working with a diet and nutrition specialist (dietitian) can help you make a meal plan that is best for you.  Keep in mind that carbohydrates (carbs) and alcohol have immediate effects on your blood glucose levels. It is important to count carbs and to use alcohol carefully. This information is not intended to replace advice given to you by your health care provider. Make sure you discuss any questions you have with your health care provider. Document Released: 02/22/2005 Document Revised: 12/26/2016 Document Reviewed: 07/02/2016 Elsevier Interactive Patient Education  2019 [employer]

## 2018-10-27 NOTE — Progress Notes (Signed)
Established Patient Office Visit  Subjective:  Patient ID: Jackson Mahoney, male    DOB: 09/08/1965  Age: 53 y.o. MRN: [id number]  CC:  Chief Complaint  Patient presents with  . Diabetes    f/u  . Medication Refill    atorvastatin, farxiga, famvir, hctz, lantus, lants solostar, linsinopril, metfromin, test strips, needles and zolpidem    HPI Jackson Mahoney presents for   Noncompliance with Diabetes Patient lat office visit was December 25, 2017.  He was given 4 months of medications with plan for close follow up. The patient reports that he started to do well with the meds and stopped eating rice and stopped taking the medication. Patient reports that he was losing weight and stopped taking his diabetes medication because he thought he was doing better with his sugars.  He states that he was not taking the medications and he stopped taking it since last year after taking it only 2-3 months. Patient reports that he restarted his insulin at 10 units at bedtime a week ago on and off He states that when he checks his sugars he  Gets readings of 100-130s.  Wt Readings from Last 3 Encounters:  10/27/18 208 lb (94.3 kg)  03/04/18 190 lb (86.2 kg)  02/24/18 190 lb (86.2 kg)   Hypertension Hypertension: Patient here for follow-up of elevated blood pressure. He is not exercising and is not adherent to low salt diet.  Blood pressure is well controlled at home. Cardiac symptoms none. Patient denies chest pain, chest pressure/discomfort, dyspnea, exertional chest pressure/discomfort, fatigue, lower extremity edema and orthopnea.  Cardiovascular risk factors: diabetes mellitus, dyslipidemia, hypertension, male gender, obesity (BMI >= 30 kg/m2) and sedentary lifestyle. Use of agents associated with hypertension: none. History of target organ damage: none. BP Readings from Last 3 Encounters:  10/27/18 130/84  03/04/18 117/77  12/25/17 126/76   Dyslipidemia: Patient presents for evaluation of lipids.   Compliance with treatment thus far has been poor.  A repeat fasting lipid profile was done.  The patient does not use medications that may worsen dyslipidemias (corticosteroids, progestins, anabolic steroids, diuretics, beta-blockers, amiodarone, cyclosporine, olanzapine). The patient exercises rarely.  The patient is not known to have coexisting coronary artery disease.   The 10-year ASCVD risk score Jackson Mahoney DC Jackson Mahoney., et al., 2013) is: 18.2%   Values used to calculate the score:     Age: 34 years     Sex: Male     Is Non-Hispanic African American: Yes     Diabetic: Yes     Tobacco smoker: No     Systolic Blood Pressure: 811 mmHg     Is BP treated: Yes     HDL Cholesterol: 47 mg/dL     Total Cholesterol: 166 mg/dL    Past Medical History:  Diagnosis Date  . Allergy   . Diabetes mellitus without complication ([place])   . Hyperlipidemia   . Hypertension     Past Surgical History:  Procedure Laterality Date  . NO PAST SURGERIES      Family History  Problem Relation Age of Onset  . Diabetes Father   . Colon cancer Neg Hx   . Colon polyps Neg Hx   . Rectal cancer Neg Hx   . Stomach cancer Neg Hx   . Esophageal cancer Neg Hx     Social History   Socioeconomic History  . Marital status: Single    Spouse name: Not on file  . Number of children: Not on file  .  Years of education: Not on file  . Highest education level: Not on file  Occupational History  . Not on file  Social Needs  . Financial resource strain: Not on file  . Food insecurity:    Worry: Not on file    Inability: Not on file  . Transportation needs:    Medical: Not on file    Non-medical: Not on file  Tobacco Use  . Smoking status: Never Smoker  . Smokeless tobacco: Never Used  Substance and Sexual Activity  . Alcohol use: Yes    Alcohol/week: 2.0 standard drinks    Types: 2 Cans of beer per week  . Drug use: No  . Sexual activity: Never  Lifestyle  . Physical activity:    Days per week: Not on file     Minutes per session: Not on file  . Stress: Not on file  Relationships  . Social connections:    Talks on phone: Not on file    Gets together: Not on file    Attends religious service: Not on file    Active member of club or organization: Not on file    Attends meetings of clubs or organizations: Not on file    Relationship status: Not on file  . Intimate partner violence:    Fear of current or ex partner: Not on file    Emotionally abused: Not on file    Physically abused: Not on file    Forced sexual activity: Not on file  Other Topics Concern  . Not on file  Social History Narrative  . Not on file    Outpatient Medications Prior to Visit  Medication Sig Dispense Refill  . ACCU-CHEK FASTCLIX LANCETS MISC USE UTD  0  . aspirin 81 MG EC tablet Take 81 mg by mouth daily. Swallow whole.    Marland Kitchen atorvastatin (LIPITOR) 40 MG tablet TAKE 1 TABLET(40 MG) BY MOUTH DAILY 30 tablet 0  . dapagliflozin propanediol (FARXIGA) 10 MG TABS tablet Take 10 mg by mouth daily. 30 tablet 3  . famciclovir (FAMVIR) 500 MG tablet TK 1 T PO BID FOR 5 DAYS  5  . glucose blood test strip Use as instructed  Diagnosis Code E11.9 100 each 12  . Insulin Pen Needle (BD PEN NEEDLE NANO U/F) 32G X 4 MM MISC USE WITH INSULIN EVERY EVENING 30 each 0  . Insulin Syringe-Needle U-100 (B-D INSULIN SYRINGE) 31G X 5/16" 0.3 ML MISC Use to administer insulin daily at bedtime 60 each 3  . LANTUS SOLOSTAR 100 UNIT/ML Solostar Pen INJECT 20 UNITS INTO THE SKIN DAILY AT 10 PM 30 mL 0  . lisinopril (PRINIVIL,ZESTRIL) 40 MG tablet TAKE 1 TABLET BY MOUTH EVERY DAY 30 tablet 0  . zolpidem (AMBIEN) 10 MG tablet TK 1 T PO HS PRN  2  . hydrochlorothiazide (HYDRODIURIL) 25 MG tablet Take 1 tablet (25 mg total) by mouth daily. 30 tablet 6  . metFORMIN (GLUCOPHAGE) 500 MG tablet TK 1 T PO D  4  . glucose monitoring kit (FREESTYLE) monitoring kit 1 each by Does not apply route as needed for other. 1 each 0  . insulin glargine (LANTUS)  100 UNIT/ML injection Inject 0.2 mLs (20 Units total) into the skin at bedtime. 10 mL 3   Facility-Administered Medications Prior to Visit  Medication Dose Route Frequency Provider Last Rate Last Dose  . 0.9 %  sodium chloride infusion  500 mL Intravenous Once Cirigliano, Brillion, DO  Allergies  Allergen Reactions  . Wasp Venom Anaphylaxis  . Chloroquine Itching    ROS Review of Systems Review of Systems  Constitutional: Negative for activity change, appetite change, chills and fever.  HENT: Negative for congestion, nosebleeds, trouble swallowing and voice change.   Respiratory: Negative for cough, shortness of breath and wheezing.   Gastrointestinal: Negative for diarrhea, nausea and vomiting.  Genitourinary: Negative for difficulty urinating, dysuria, flank pain and hematuria.  Musculoskeletal: Negative for back pain, joint swelling and neck pain.  Neurological: Negative for dizziness, speech difficulty, light-headedness and numbness.  See HPI. All other review of systems negative.     Objective:    Physical Exam  BP 130/84 (BP Location: Left Arm, Patient Position: Sitting, Cuff Size: Large)   Pulse (!) 112   Temp 98.6 F (37 C) (Oral)   Resp 16   Ht _0  (1.803 m)   Wt 208 lb (94.3 kg)   SpO2 98%   BMI 29.01 kg/m  Wt Readings from Last 3 Encounters:  10/27/18 208 lb (94.3 kg)  03/04/18 190 lb (86.2 kg)  02/24/18 190 lb (86.2 kg)   Physical Exam  Constitutional: Oriented to person, place, and time. Appears well-developed and well-nourished.  HENT:  Head: Normocephalic and atraumatic.  Eyes: Conjunctivae and EOM are normal.  Cardiovascular: Normal rate, regular rhythm, normal heart sounds and intact distal pulses.  No murmur heard. Pulmonary/Chest: Effort normal and breath sounds normal. No stridor. No respiratory distress. Has no wheezes.  Neurological: Is alert and oriented to person, place, and time.  Skin: Skin is warm. Capillary refill takes less  than 2 seconds.  Psychiatric: Has a normal mood and affect. Behavior is normal. Judgment and thought content normal.    Health Maintenance Due  Topic Date Due  . HIV Screening  08/01/1980  . HEMOGLOBIN A1C  05/23/2018    There are no preventive care reminders to display for this patient.  No results found for: TSH Lab Results  Component Value Date   WBC 5.2 06/01/2017   HGB 12.1 (A) 06/01/2017   HCT 37.6 (A) 06/01/2017   MCV 84.8 06/01/2017   PLT 218 06/01/2017   Lab Results  Component Value Date   NA 133 (L) 12/25/2017   K 5.0 12/25/2017   CO2 21 12/25/2017   GLUCOSE 190 (H) 12/25/2017   BUN 26 (H) 12/25/2017   CREATININE 1.48 (H) 12/25/2017   BILITOT 0.3 11/21/2017   ALKPHOS 73 11/21/2017   AST 15 11/21/2017   ALT 12 11/21/2017   PROT 7.3 11/21/2017   ALBUMIN 4.1 11/21/2017   CALCIUM 9.6 12/25/2017   Lab Results  Component Value Date   CHOL 166 11/21/2017   Lab Results  Component Value Date   HDL 47 11/21/2017   Lab Results  Component Value Date   LDLCALC 107 (H) 11/21/2017   Lab Results  Component Value Date   TRIG 59 11/21/2017   Lab Results  Component Value Date   CHOLHDL 3.5 11/21/2017   Lab Results  Component Value Date   HGBA1C 14.0 (A) 10/27/2018      Assessment & Plan:   Problem List Items Addressed This Visit      Cardiovascular and Mediastinum   Essential hypertension  - continue lisinopril May remain off hctz since his bp is well controlled with just lisinopril     Endocrine   Uncontrolled type 2 diabetes mellitus ([place]) - Primary Spent time teaching patient with graphs of his basal insulin vs. Prandial  Discussed gluconeogenesis and the role of the liver in glucose regulation Discussed why prolonged fasting is dangerous Discussed mechanism of action of the farxiga and metformin Discussed risk of uncontrolled diabetes and his ascvd risk   Relevant Medications   metFORMIN (GLUCOPHAGE) 500 MG tablet   Other Relevant Orders    Lipid panel   CMP14+EGFR   TSH   POCT glycosylated hemoglobin (Hb A1C) (Completed)   Ambulatory referral to Endocrinology   Ambulatory referral to diabetic education     Other   Dyslipidemia - continue lipitor, discussed diet    Relevant Orders   Lipid panel   CMP14+EGFR   TSH    Other Visit Diagnoses    Medically noncompliant    -  Will refer to nutrition and Endocrinology Will do phone visit in 2 weeks for close follow Will plan to see patient monthly until improvement is demonstrated With the phone visits being an option patient does not have to miss a whole day from work      Meds ordered this encounter  Medications  . metFORMIN (GLUCOPHAGE) 500 MG tablet    Sig: Take 1 tablet (500 mg total) by mouth 3 (three) times daily.    Dispense:  90 tablet    Refill:  0    Follow-up: No follow-ups on file.    Forrest Moron, MD

## 2018-10-28 LAB — CMP14+EGFR
ALT: 10 IU/L (ref 0–44)
AST: 18 IU/L (ref 0–40)
Albumin/Globulin Ratio: 1.3 (ref 1.2–2.2)
Albumin: 4.2 g/dL (ref 3.8–4.9)
Alkaline Phosphatase: 65 IU/L (ref 39–117)
BUN/Creatinine Ratio: 15 (ref 9–20)
BUN: 19 mg/dL (ref 6–24)
Bilirubin Total: 0.3 mg/dL (ref 0.0–1.2)
CO2: 20 mmol/L (ref 20–29)
Calcium: 9.9 mg/dL (ref 8.7–10.2)
Chloride: 100 mmol/L (ref 96–106)
Creatinine, Ser: 1.3 mg/dL — ABNORMAL HIGH (ref 0.76–1.27)
GFR calc Af Amer: 72 mL/min/{1.73_m2} (ref >59)
GFR calc non Af Amer: 62 mL/min/{1.73_m2} (ref >59)
Globulin, Total: 3.2 g/dL (ref 1.5–4.5)
Glucose: 241 mg/dL — ABNORMAL HIGH (ref 65–99)
Potassium: 4.8 mmol/L (ref 3.5–5.2)
Sodium: 133 mmol/L — ABNORMAL LOW (ref 134–144)
Total Protein: 7.4 g/dL (ref 6.0–8.5)

## 2018-10-28 LAB — LIPID PANEL
Chol/HDL Ratio: 5.4 ratio — ABNORMAL HIGH (ref 0.0–5.0)
Cholesterol, Total: 211 mg/dL — ABNORMAL HIGH (ref 100–199)
HDL: 39 mg/dL — ABNORMAL LOW (ref >39)
LDL Calculated: 159 mg/dL — ABNORMAL HIGH (ref 0–99)
Triglycerides: 67 mg/dL (ref 0–149)
VLDL Cholesterol Cal: 13 mg/dL (ref 5–40)

## 2018-10-28 LAB — TSH: TSH: 2.16 u[IU]/mL (ref 0.450–4.500)

## 2018-10-29 ENCOUNTER — Other Ambulatory Visit: Payer: Self-pay | Admitting: Family Medicine

## 2018-10-29 DIAGNOSIS — E1165 Type 2 diabetes mellitus with hyperglycemia: Secondary | ICD-10-CM

## 2018-11-10 DIAGNOSIS — M545 Low back pain: Secondary | ICD-10-CM | POA: Diagnosis not present

## 2018-11-10 DIAGNOSIS — E7849 Other hyperlipidemia: Secondary | ICD-10-CM | POA: Diagnosis not present

## 2018-11-10 DIAGNOSIS — E119 Type 2 diabetes mellitus without complications: Secondary | ICD-10-CM | POA: Diagnosis not present

## 2018-11-10 DIAGNOSIS — G4700 Insomnia, unspecified: Secondary | ICD-10-CM | POA: Diagnosis not present

## 2018-11-11 ENCOUNTER — Telehealth (INDEPENDENT_AMBULATORY_CARE_PROVIDER_SITE_OTHER): Payer: BC Managed Care – PPO | Admitting: Family Medicine

## 2018-11-11 ENCOUNTER — Other Ambulatory Visit: Payer: Self-pay

## 2018-11-11 DIAGNOSIS — E785 Hyperlipidemia, unspecified: Secondary | ICD-10-CM

## 2018-11-11 DIAGNOSIS — E1165 Type 2 diabetes mellitus with hyperglycemia: Secondary | ICD-10-CM | POA: Diagnosis not present

## 2018-11-11 NOTE — Patient Instructions (Signed)
° ° ° °  If you have lab work done today you will be contacted with your lab results within the next 2 weeks.  If you have not heard from us then please contact us. The fastest way to get your results is to register for My Chart. ° ° °IF you received an x-ray today, you will receive an invoice from [facility]. Please contact [facility] at [phone] with questions or concerns regarding your invoice.  ° °IF you received labwork today, you will receive an invoice from [facility]. Please contact [facility] at [phone] with questions or concerns regarding your invoice.  ° °Our billing staff will not be able to assist you with questions regarding bills from these companies. ° °You will be contacted with the lab results as soon as they are available. The fastest way to get your results is to activate your My Chart account. Instructions are located on the last page of this paperwork. If you have not heard from us regarding the results in 2 weeks, please contact this office. °  ° ° ° °

## 2018-11-11 NOTE — Progress Notes (Signed)
Telemedicine Encounter- SOAP NOTE Established Patient  This telephone encounter was conducted with the patient's (or proxy's) verbal consent via audio telecommunications: yes/no: Yes Patient was instructed to have this encounter in a suitably private space; and to only have persons present to whom they give permission to participate. In addition, patient identity was confirmed by use of name plus two identifiers (DOB and address).  I discussed the limitations, risks, security and privacy concerns of performing an evaluation and management service by telephone and the availability of in person appointments. I also discussed with the patient that there may be a patient responsible charge related to this service. The patient expressed understanding and agreed to proceed.  I spent a total of TIME; 0 MIN TO 60 MIN: 20 minutes talking with the patient or their proxy.  CC: diabetes  Subjective   Jackson Mahoney is a 53 y.o. established patient. Telephone visit today for  HPI  Diabetes Mellitus Pt will have an appt 12/03/2018 with Nutrition He is eating 2-3 meals a day He fasts from 6pm at night to 8am in the morning In the morning he typically has a headache. He states that he has been checking his a1c on a machine at home and it says his a1c is 10%.  He is urinating less and feels motivated to do better. His current regimen is Wilder Glade, Lantus 20 units at bedtime   Dyslipidemia Pt reports that he stopped his lipid medication as well He states that he just stopped taking everything but he is taking it now He had plenty left over from his last prescription refill  Lab Results  Component Value Date   CHOL 211 (H) 10/27/2018   CHOL 166 11/21/2017   CHOL 162 01/02/2017   Lab Results  Component Value Date   HDL 39 (L) 10/27/2018   HDL 47 11/21/2017   HDL 37 (L) 01/02/2017   Lab Results  Component Value Date   LDLCALC 159 (H) 10/27/2018   LDLCALC 107 (H) 11/21/2017   LDLCALC 111 (H)  01/02/2017   Lab Results  Component Value Date   TRIG 67 10/27/2018   TRIG 59 11/21/2017   TRIG 72 01/02/2017   Lab Results  Component Value Date   CHOLHDL 5.4 (H) 10/27/2018   CHOLHDL 3.5 11/21/2017   CHOLHDL 4.4 01/02/2017   No results found for: LDLDIRECT   Patient Active Problem List   Diagnosis Date Noted  . Dyslipidemia 11/22/2017  . CRI (chronic renal insufficiency), stage 2 (mild) 08/08/2016  . Microalbuminuria due to type 2 diabetes mellitus ([place]) 08/08/2016  . Uncontrolled type 2 diabetes mellitus ([place]) 08/08/2016  . Diabetes mellitus without complication ([place]) 40/01/6760  . Essential hypertension 07/25/2012  . Mixed hyperlipidemia 07/25/2012    Past Medical History:  Diagnosis Date  . Allergy   . Diabetes mellitus without complication ([place])   . Hyperlipidemia   . Hypertension     Current Outpatient Medications  Medication Sig Dispense Refill  . ACCU-CHEK FASTCLIX LANCETS MISC USE UTD  0  . aspirin 81 MG EC tablet Take 81 mg by mouth daily. Swallow whole.    Marland Kitchen atorvastatin (LIPITOR) 40 MG tablet TAKE 1 TABLET(40 MG) BY MOUTH DAILY 30 tablet 0  . FARXIGA 10 MG TABS tablet TAKE 1 TABLET BY MOUTH DAILY 90 tablet 0  . glucose blood test strip Use as instructed  Diagnosis Code E11.9 100 each 12  . Insulin Pen Needle (BD PEN NEEDLE NANO U/F) 32G X 4 MM MISC  USE WITH INSULIN EVERY EVENING 30 each 0  . Insulin Syringe-Needle U-100 (B-D INSULIN SYRINGE) 31G X 5/16" 0.3 ML MISC Use to administer insulin daily at bedtime 60 each 3  . LANTUS SOLOSTAR 100 UNIT/ML Solostar Pen INJECT 20 UNITS INTO THE SKIN DAILY AT 10 PM 30 mL 0  . lisinopril (ZESTRIL) 40 MG tablet TAKE 1 TABLET BY MOUTH EVERY DAY 90 tablet 0  . metFORMIN (GLUCOPHAGE) 500 MG tablet Take 1 tablet (500 mg total) by mouth 3 (three) times daily. 90 tablet 0  . zolpidem (AMBIEN) 10 MG tablet TK 1 T PO HS PRN  2  . famciclovir (FAMVIR) 500 MG tablet TK 1 T PO BID FOR 5 DAYS  5  . glucose monitoring kit  (FREESTYLE) monitoring kit 1 each by Does not apply route as needed for other. 1 each 0  . insulin glargine (LANTUS) 100 UNIT/ML injection Inject 0.2 mLs (20 Units total) into the skin at bedtime. 10 mL 3   Current Facility-Administered Medications  Medication Dose Route Frequency Provider Last Rate Last Dose  . 0.9 %  sodium chloride infusion  500 mL Intravenous Once Cirigliano, Vito V, DO        Allergies  Allergen Reactions  . Wasp Venom Anaphylaxis  . Chloroquine Itching    Social History   Socioeconomic History  . Marital status: Single    Spouse name: Not on file  . Number of children: Not on file  . Years of education: Not on file  . Highest education level: Not on file  Occupational History  . Not on file  Social Needs  . Financial resource strain: Not on file  . Food insecurity:    Worry: Not on file    Inability: Not on file  . Transportation needs:    Medical: Not on file    Non-medical: Not on file  Tobacco Use  . Smoking status: Never Smoker  . Smokeless tobacco: Never Used  Substance and Sexual Activity  . Alcohol use: Yes    Alcohol/week: 2.0 standard drinks    Types: 2 Cans of beer per week  . Drug use: No  . Sexual activity: Never  Lifestyle  . Physical activity:    Days per week: Not on file    Minutes per session: Not on file  . Stress: Not on file  Relationships  . Social connections:    Talks on phone: Not on file    Gets together: Not on file    Attends religious service: Not on file    Active member of club or organization: Not on file    Attends meetings of clubs or organizations: Not on file    Relationship status: Not on file  . Intimate partner violence:    Fear of current or ex partner: Not on file    Emotionally abused: Not on file    Physically abused: Not on file    Forced sexual activity: Not on file  Other Topics Concern  . Not on file  Social History Narrative  . Not on file    ROS Review of Systems  Constitutional:  Negative for activity change, appetite change, chills and fever.  HENT: Negative for congestion, nosebleeds, trouble swallowing and voice change.   Respiratory: Negative for cough, shortness of breath and wheezing.   Gastrointestinal: Negative for diarrhea, nausea and vomiting.  Genitourinary: Negative for difficulty urinating, dysuria, flank pain and hematuria.  Musculoskeletal: Negative for back pain, joint swelling and neck pain.  Neurological: Negative for dizziness, speech difficulty, light-headedness and numbness.  See HPI. All other review of systems negative.   Objective   No exam due to phone visit Normal pulmonary effort  Vitals as reported by the patient: There were no vitals filed for this visit.  Diagnoses and all orders for this visit:  Uncontrolled type 2 diabetes mellitus with hyperglycemia, without long-term current use of insulin ([place])   -  Discussed that he should monitor his daily blood glucose -  Continue goals for a1c  Hyperlipidemia -  Discussed worsening of the lipids -  Pt will improve compliance, will recheck  Will keep the medication at same dose since he previously did well at that dose   I discussed the assessment and treatment plan with the patient. The patient was provided an opportunity to ask questions and all were answered. The patient agreed with the plan and demonstrated an understanding of the instructions.   The patient was advised to call back or seek an in-person evaluation if the symptoms worsen or if the condition fails to improve as anticipated.  I provided 20 minutes of non-face-to-face time during this encounter.  Forrest Moron, MD  Primary Care at [facility]

## 2018-11-11 NOTE — Progress Notes (Signed)
CC: 2 week f/u diabetes management.  No medication refills needed.   No other concerns today.  No recent weight or bp taken since ov.  No travel outside the [place] or [place] in the past 3 weeks.

## 2018-11-12 ENCOUNTER — Telehealth: Payer: Self-pay | Admitting: Family Medicine

## 2018-11-12 NOTE — Telephone Encounter (Signed)
Called pt LVM for him to call back. Pt  and schedule 1 month follow up with Creta Levin for diabetes FR

## 2018-11-18 NOTE — Telephone Encounter (Signed)
Already been seen by Dr. Nolon Rod

## 2018-12-03 ENCOUNTER — Ambulatory Visit: Payer: BLUE CROSS/BLUE SHIELD | Admitting: *Deleted

## 2018-12-16 ENCOUNTER — Other Ambulatory Visit: Payer: Self-pay | Admitting: Family Medicine

## 2018-12-16 DIAGNOSIS — E1165 Type 2 diabetes mellitus with hyperglycemia: Secondary | ICD-10-CM

## 2018-12-17 ENCOUNTER — Ambulatory Visit: Payer: BC Managed Care – PPO | Admitting: Family Medicine

## 2018-12-19 ENCOUNTER — Encounter: Payer: Self-pay | Admitting: Family Medicine

## 2019-01-05 DIAGNOSIS — E119 Type 2 diabetes mellitus without complications: Secondary | ICD-10-CM | POA: Diagnosis not present

## 2019-01-05 DIAGNOSIS — G4700 Insomnia, unspecified: Secondary | ICD-10-CM | POA: Diagnosis not present

## 2019-01-05 DIAGNOSIS — E7849 Other hyperlipidemia: Secondary | ICD-10-CM | POA: Diagnosis not present

## 2019-01-15 ENCOUNTER — Other Ambulatory Visit: Payer: Self-pay | Admitting: Family Medicine

## 2019-01-15 DIAGNOSIS — E1165 Type 2 diabetes mellitus with hyperglycemia: Secondary | ICD-10-CM

## 2019-02-18 ENCOUNTER — Other Ambulatory Visit: Payer: Self-pay | Admitting: Family Medicine

## 2019-02-18 DIAGNOSIS — E1165 Type 2 diabetes mellitus with hyperglycemia: Secondary | ICD-10-CM

## 2019-02-27 ENCOUNTER — Other Ambulatory Visit: Payer: Self-pay | Admitting: Family Medicine

## 2019-02-27 DIAGNOSIS — E1165 Type 2 diabetes mellitus with hyperglycemia: Secondary | ICD-10-CM

## 2019-03-27 ENCOUNTER — Telehealth: Payer: Self-pay | Admitting: Family Medicine

## 2019-03-27 NOTE — Telephone Encounter (Signed)
Patient is calling because metFORMIN (GLUCOPHAGE) 500 MG tablet [[id number]]  Has been recalled. Wanting to know if his medication is included in the recall.   Please advise [phone]

## 2019-03-30 ENCOUNTER — Telehealth: Payer: Self-pay | Admitting: Family Medicine

## 2019-03-30 NOTE — Telephone Encounter (Signed)
Copied from [place] [id number]. Topic: General - Call Back - No Documentation >> Mar 27, 2019 12:25 PM Erick Blinks wrote: Reason for CRM: Pt has questions about metformin, please advise. Best contact: [phone]

## 2019-04-02 ENCOUNTER — Other Ambulatory Visit: Payer: Self-pay

## 2019-04-02 DIAGNOSIS — E1165 Type 2 diabetes mellitus with hyperglycemia: Secondary | ICD-10-CM

## 2019-04-02 MED ORDER — METFORMIN HCL 500 MG PO TABS: 500.0000 mg | ORAL_TABLET | Freq: Three times a day (TID) | ORAL | 0 refills | Status: DC

## 2019-04-02 NOTE — Telephone Encounter (Signed)
Spoke with pt about medication and informed him that his medication was not recalled, that it was a different brand, he verbalized understanding and a refill was sent.

## 2019-04-02 NOTE — Telephone Encounter (Signed)
Spoke with pt about concerns  

## 2019-04-08 DIAGNOSIS — Z23 Encounter for immunization: Secondary | ICD-10-CM | POA: Diagnosis not present

## 2019-04-15 ENCOUNTER — Telehealth (INDEPENDENT_AMBULATORY_CARE_PROVIDER_SITE_OTHER): Payer: BC Managed Care – PPO | Admitting: Family Medicine

## 2019-04-15 ENCOUNTER — Other Ambulatory Visit: Payer: Self-pay

## 2019-04-15 ENCOUNTER — Encounter: Payer: Self-pay | Admitting: Family Medicine

## 2019-04-15 VITALS — Ht 70.0 in | Wt 215.0 lb

## 2019-04-15 DIAGNOSIS — U071 COVID-19: Secondary | ICD-10-CM

## 2019-04-15 DIAGNOSIS — E1165 Type 2 diabetes mellitus with hyperglycemia: Secondary | ICD-10-CM | POA: Diagnosis not present

## 2019-04-15 MED ORDER — ALBUTEROL SULFATE HFA 108 (90 BASE) MCG/ACT IN AERS: 2.0000 | INHALATION_SPRAY | Freq: Four times a day (QID) | RESPIRATORY_TRACT | 1 refills | Status: DC | PRN

## 2019-04-15 MED ORDER — BENZONATATE 100 MG PO CAPS: 100.0000 mg | ORAL_CAPSULE | Freq: Two times a day (BID) | ORAL | 3 refills | Status: DC | PRN

## 2019-04-15 NOTE — Progress Notes (Signed)
Telemedicine Encounter- SOAP NOTE Established Patient  This telephone encounter was conducted with the patient's (or proxy's) verbal consent via audio telecommunications: yes/no: Yes Patient was instructed to have this encounter in a suitably private space; and to only have persons present to whom they give permission to participate. In addition, patient identity was confirmed by use of name plus two identifiers (DOB and address).  I discussed the limitations, risks, security and privacy concerns of performing an evaluation and management service by telephone and the availability of in person appointments. I also discussed with the patient that there may be a patient responsible charge related to this service. The patient expressed understanding and agreed to proceed.  I spent a total of TIME; 0 MIN TO 60 MIN: 25 minutes talking with the patient or their proxy.  CC: COVID, viral symptoms, diabetes  Subjective   Jackson Mahoney is a 53 y.o. established patient. Telephone visit today for  HPI  Diagnosed with covid 1 weeks ago States that he has intermittent fevers, fatigue and DOE He states that his appetite improved slightly He is drinking water  He is taking tylenol  Temperature is in the 100 But mostly chills No diarrhea He denies wheezing  He is able to sleep  Diabetes Mellitus Reports that his sugars were very high He was not taking his diabetes meds since he was not eating His glucose increase to 200s He is back on his diabetes meds now that he can eat more   Patient Active Problem List   Diagnosis Date Noted  . Dyslipidemia 11/22/2017  . CRI (chronic renal insufficiency), stage 2 (mild) 08/08/2016  . Microalbuminuria due to type 2 diabetes mellitus ([place]) 08/08/2016  . Uncontrolled type 2 diabetes mellitus ([place]) 08/08/2016  . Diabetes mellitus without complication ([place]) 03/47/4259  . Essential hypertension 07/25/2012  . Mixed hyperlipidemia 07/25/2012    Past Medical  History:  Diagnosis Date  . Allergy   . Diabetes mellitus without complication ([place])   . Hyperlipidemia   . Hypertension     Current Outpatient Medications  Medication Sig Dispense Refill  . ACCU-CHEK FASTCLIX LANCETS MISC USE UTD  0  . aspirin 81 MG EC tablet Take 81 mg by mouth daily. Swallow whole.    Marland Kitchen atorvastatin (LIPITOR) 40 MG tablet TAKE 1 TABLET(40 MG) BY MOUTH DAILY 30 tablet 0  . famciclovir (FAMVIR) 500 MG tablet TK 1 T PO BID FOR 5 DAYS  5  . FARXIGA 10 MG TABS tablet TAKE 1 TABLET BY MOUTH DAILY 90 tablet 0  . glucose blood test strip Use as instructed  Diagnosis Code E11.9 100 each 12  . Insulin Pen Needle (BD PEN NEEDLE NANO U/F) 32G X 4 MM MISC USE WITH INSULIN EVERY EVENING 100 each 2  . Insulin Syringe-Needle U-100 (B-D INSULIN SYRINGE) 31G X 5/16" 0.3 ML MISC Use to administer insulin daily at bedtime 60 each 3  . LANTUS SOLOSTAR 100 UNIT/ML Solostar Pen INJECT 20 UNITS INTO THE SKIN DAILY AT 10 PM 30 mL 0  . lisinopril (ZESTRIL) 40 MG tablet TAKE 1 TABLET BY MOUTH EVERY DAY 90 tablet 0  . metFORMIN (GLUCOPHAGE) 500 MG tablet Take 1 tablet (500 mg total) by mouth 3 (three) times daily. 90 tablet 0  . albuterol (VENTOLIN HFA) 108 (90 Base) MCG/ACT inhaler Inhale 2 puffs into the lungs every 6 (six) hours as needed for wheezing or shortness of breath. 18 g 1  . benzonatate (TESSALON) 100 MG capsule Take 1  capsule (100 mg total) by mouth 2 (two) times daily as needed for cough. 20 capsule 3  . glucose monitoring kit (FREESTYLE) monitoring kit 1 each by Does not apply route as needed for other. 1 each 0  . insulin glargine (LANTUS) 100 UNIT/ML injection Inject 0.2 mLs (20 Units total) into the skin at bedtime. 10 mL 3  . zolpidem (AMBIEN) 10 MG tablet TK 1 T PO HS PRN  2   Current Facility-Administered Medications  Medication Dose Route Frequency Provider Last Rate Last Dose  . 0.9 %  sodium chloride infusion  500 mL Intravenous Once Cirigliano, Vito V, DO         Allergies  Allergen Reactions  . Wasp Venom Anaphylaxis  . Chloroquine Itching    Social History   Socioeconomic History  . Marital status: Single    Spouse name: Not on file  . Number of children: Not on file  . Years of education: Not on file  . Highest education level: Not on file  Occupational History  . Not on file  Social Needs  . Financial resource strain: Not on file  . Food insecurity    Worry: Not on file    Inability: Not on file  . Transportation needs    Medical: Not on file    Non-medical: Not on file  Tobacco Use  . Smoking status: Never Smoker  . Smokeless tobacco: Never Used  Substance and Sexual Activity  . Alcohol use: Yes    Alcohol/week: 2.0 standard drinks    Types: 2 Cans of beer per week  . Drug use: No  . Sexual activity: Never  Lifestyle  . Physical activity    Days per week: Not on file    Minutes per session: Not on file  . Stress: Not on file  Relationships  . Social Herbalist on phone: Not on file    Gets together: Not on file    Attends religious service: Not on file    Active member of club or organization: Not on file    Attends meetings of clubs or organizations: Not on file    Relationship status: Not on file  . Intimate partner violence    Fear of current or ex partner: Not on file    Emotionally abused: Not on file    Physically abused: Not on file    Forced sexual activity: Not on file  Other Topics Concern  . Not on file  Social History Narrative  . Not on file    ROS See hpi  Objective   Vitals as reported by the patient: Today's Vitals   04/15/19 1005  Weight: 215 lb (97.5 kg)  Height: _0  (1.778 m)    Diagnoses and all orders for this visit:  Uncontrolled type 2 diabetes mellitus with hyperglycemia, without long-term current use of insulin ([place]) -  Advised to resume his diabetes meds and hyperglycemia slows his healing  COVID-19 virus infection - advised supportive care with albuterol  and tessalon perles  Discussed signs of pneumonia and bronchitis and when to go to the ER Pt verbalized understanding -     albuterol (VENTOLIN HFA) 108 (90 Base) MCG/ACT inhaler; Inhale 2 puffs into the lungs every 6 (six) hours as needed for wheezing or shortness of breath.  Other orders -     benzonatate (TESSALON) 100 MG capsule; Take 1 capsule (100 mg total) by mouth 2 (two) times daily as needed for cough.  I discussed the assessment and treatment plan with the patient. The patient was provided an opportunity to ask questions and all were answered. The patient agreed with the plan and demonstrated an understanding of the instructions.   The patient was advised to call back or seek an in-person evaluation if the symptoms worsen or if the condition fails to improve as anticipated.  I provided 25 minutes of non-face-to-face time during this encounter.  Forrest Moron, MD  Primary Care at [facility]

## 2019-04-15 NOTE — Progress Notes (Signed)
TESTED POS for COVID  Fever  Chills   MED refills    Letter pended and we can fax to employer once he give [place] the fax number.

## 2019-04-15 NOTE — Patient Instructions (Signed)
° ° ° °  If you have lab work done today you will be contacted with your lab results within the next 2 weeks.  If you have not heard from us then please contact us. The fastest way to get your results is to register for My Chart. ° ° °IF you received an x-ray today, you will receive an invoice from [facility]. Please contact [facility] at [phone] with questions or concerns regarding your invoice.  ° °IF you received labwork today, you will receive an invoice from [facility]. Please contact [facility] at [phone] with questions or concerns regarding your invoice.  ° °Our billing staff will not be able to assist you with questions regarding bills from these companies. ° °You will be contacted with the lab results as soon as they are available. The fastest way to get your results is to activate your My Chart account. Instructions are located on the last page of this paperwork. If you have not heard from us regarding the results in 2 weeks, please contact this office. °  ° ° ° °

## 2019-04-16 ENCOUNTER — Telehealth: Payer: Self-pay | Admitting: Family Medicine

## 2019-04-16 NOTE — Telephone Encounter (Signed)
Copied from [place] [id number]. Topic: General - Other >> Apr 16, 2019  9:48 AM Burchel, Abbi R wrote: Please fax work note to employer.    [place] Rao [phone]

## 2019-04-17 NOTE — Telephone Encounter (Signed)
Letter has been printed and sent to employer, pt has been informed.

## 2019-04-17 NOTE — Telephone Encounter (Signed)
Letter has been faxed to employer, pt notified.

## 2019-04-22 ENCOUNTER — Other Ambulatory Visit: Payer: Self-pay

## 2019-04-22 ENCOUNTER — Telehealth (INDEPENDENT_AMBULATORY_CARE_PROVIDER_SITE_OTHER): Payer: BC Managed Care – PPO | Admitting: Registered Nurse

## 2019-04-22 DIAGNOSIS — U071 COVID-19: Secondary | ICD-10-CM

## 2019-04-22 MED ORDER — ALBUTEROL SULFATE HFA 108 (90 BASE) MCG/ACT IN AERS: 2.0000 | INHALATION_SPRAY | Freq: Four times a day (QID) | RESPIRATORY_TRACT | 1 refills | Status: DC | PRN

## 2019-04-22 NOTE — Progress Notes (Signed)
Spoke with pt and he states he has been having SHOB and headaches after having COVID 2 weeks ago. He states he has not had any fever and bodyaches but the [facility] is still present. No cough at this time.

## 2019-04-22 NOTE — Progress Notes (Signed)
Telemedicine Encounter- SOAP NOTE Established Patient  This telephone encounter was conducted with the patient's (or proxy's) verbal consent via audio telecommunications: yes  Patient was instructed to have this encounter in a suitably private space; and to only have persons present to whom they give permission to participate. In addition, patient identity was confirmed by use of name plus two identifiers (DOB and address).  I discussed the limitations, risks, security and privacy concerns of performing an evaluation and management service by telephone and the availability of in person appointments. I also discussed with the patient that there may be a patient responsible charge related to this service. The patient expressed understanding and agreed to proceed.  I spent a total of 11 minutes talking with the patient or their proxy.  No chief complaint on file.   Subjective   Jackson Mahoney is a 53 y.o. established patient. Telephone visit today for ongoing COVID symptoms  HPI Pt was originally dx 2 weeks ago when symptoms onset - he was seen via telemed with his pcp Dr. Nolon Rod, who had given him Albuterol and tessalon. These had good effect, and his cough has been limited. She provided him with a work note for the past two weeks However, at this point, he is still having significant malaise and shortness of breath. He states overall he is improved, but is confident that he will not be able to complete his work duties at this time. He is hoping for another work note as he is afraid of getting hurt on the job or infecting his coworkers.  Denies fever, chest pain, change in vision taste or smell, dependent edema. Notes that his blood sugars have been high - highest was 295. Denies symptoms of cyanosis with his shob. Denies dizziness or LOC.  Patient Active Problem List   Diagnosis Date Noted  . Dyslipidemia 11/22/2017  . CRI (chronic renal insufficiency), stage 2 (mild) 08/08/2016  .  Microalbuminuria due to type 2 diabetes mellitus ([place]) 08/08/2016  . Uncontrolled type 2 diabetes mellitus ([place]) 08/08/2016  . Diabetes mellitus without complication ([place]) 23/95/3202  . Essential hypertension 07/25/2012  . Mixed hyperlipidemia 07/25/2012    Past Medical History:  Diagnosis Date  . Allergy   . Diabetes mellitus without complication ([place])   . Hyperlipidemia   . Hypertension     Current Outpatient Medications  Medication Sig Dispense Refill  . ACCU-CHEK FASTCLIX LANCETS MISC USE UTD  0  . albuterol (VENTOLIN HFA) 108 (90 Base) MCG/ACT inhaler Inhale 2 puffs into the lungs every 6 (six) hours as needed for wheezing or shortness of breath. 18 g 1  . aspirin 81 MG EC tablet Take 81 mg by mouth daily. Swallow whole.    Marland Kitchen atorvastatin (LIPITOR) 40 MG tablet TAKE 1 TABLET(40 MG) BY MOUTH DAILY 30 tablet 0  . benzonatate (TESSALON) 100 MG capsule Take 1 capsule (100 mg total) by mouth 2 (two) times daily as needed for cough. 20 capsule 3  . famciclovir (FAMVIR) 500 MG tablet TK 1 T PO BID FOR 5 DAYS  5  . FARXIGA 10 MG TABS tablet TAKE 1 TABLET BY MOUTH DAILY 90 tablet 0  . glucose blood test strip Use as instructed  Diagnosis Code E11.9 100 each 12  . Insulin Pen Needle (BD PEN NEEDLE NANO U/F) 32G X 4 MM MISC USE WITH INSULIN EVERY EVENING 100 each 2  . Insulin Syringe-Needle U-100 (B-D INSULIN SYRINGE) 31G X 5/16" 0.3 ML MISC Use to administer insulin  daily at bedtime 60 each 3  . LANTUS SOLOSTAR 100 UNIT/ML Solostar Pen INJECT 20 UNITS INTO THE SKIN DAILY AT 10 PM 30 mL 0  . lisinopril (ZESTRIL) 40 MG tablet TAKE 1 TABLET BY MOUTH EVERY DAY 90 tablet 0  . metFORMIN (GLUCOPHAGE) 500 MG tablet Take 1 tablet (500 mg total) by mouth 3 (three) times daily. 90 tablet 0  . zolpidem (AMBIEN) 10 MG tablet TK 1 T PO HS PRN  2  . glucose monitoring kit (FREESTYLE) monitoring kit 1 each by Does not apply route as needed for other. 1 each 0  . insulin glargine (LANTUS) 100 UNIT/ML  injection Inject 0.2 mLs (20 Units total) into the skin at bedtime. 10 mL 3   Current Facility-Administered Medications  Medication Dose Route Frequency Provider Last Rate Last Dose  . 0.9 %  sodium chloride infusion  500 mL Intravenous Once Cirigliano, Vito V, DO        Allergies  Allergen Reactions  . Wasp Venom Anaphylaxis  . Chloroquine Itching    Social History   Socioeconomic History  . Marital status: Single    Spouse name: Not on file  . Number of children: Not on file  . Years of education: Not on file  . Highest education level: Not on file  Occupational History  . Not on file  Social Needs  . Financial resource strain: Not on file  . Food insecurity    Worry: Not on file    Inability: Not on file  . Transportation needs    Medical: Not on file    Non-medical: Not on file  Tobacco Use  . Smoking status: Never Smoker  . Smokeless tobacco: Never Used  Substance and Sexual Activity  . Alcohol use: Yes    Alcohol/week: 2.0 standard drinks    Types: 2 Cans of beer per week  . Drug use: No  . Sexual activity: Never  Lifestyle  . Physical activity    Days per week: Not on file    Minutes per session: Not on file  . Stress: Not on file  Relationships  . Social Herbalist on phone: Not on file    Gets together: Not on file    Attends religious service: Not on file    Active member of club or organization: Not on file    Attends meetings of clubs or organizations: Not on file    Relationship status: Not on file  . Intimate partner violence    Fear of current or ex partner: Not on file    Emotionally abused: Not on file    Physically abused: Not on file    Forced sexual activity: Not on file  Other Topics Concern  . Not on file  Social History Narrative  . Not on file    ROS Per hpi   Objective   Vitals as reported by the patient: There were no vitals filed for this visit.  Diagnoses and all orders for this visit:  COVID-19 virus  infection -     albuterol (VENTOLIN HFA) 108 (90 Base) MCG/ACT inhaler; Inhale 2 puffs into the lungs every 6 (six) hours as needed for wheezing or shortness of breath.   PLAN  Continue with albuterol PRN  Continue rest and supportive measures, in particular with focus on hydration with water or other clear liquids  Note will be provided through November 20 - he will call back if he needs further documentation  Patient  encouraged to call clinic with any questions, comments, or concerns.    I discussed the assessment and treatment plan with the patient. The patient was provided an opportunity to ask questions and all were answered. The patient agreed with the plan and demonstrated an understanding of the instructions.   The patient was advised to call back or seek an in-person evaluation if the symptoms worsen or if the condition fails to improve as anticipated.  I provided 11 minutes of non-face-to-face time during this encounter.  Maximiano Coss, NP  Primary Care at [facility]

## 2019-05-05 ENCOUNTER — Other Ambulatory Visit: Payer: Self-pay | Admitting: Family Medicine

## 2019-05-05 MED ORDER — LISINOPRIL 40 MG PO TABS: 40.0000 mg | ORAL_TABLET | Freq: Every day | ORAL | 0 refills | Status: DC

## 2019-05-05 NOTE — Telephone Encounter (Signed)
Patient requesting lisinopril (ZESTRIL) 40 MG tablet, informed please allow 48 to 72 hour turn around, patient traveling and would like to know if medication can be expedited.  [place], [place] AT [place]

## 2019-05-19 ENCOUNTER — Other Ambulatory Visit: Payer: Self-pay

## 2019-05-19 ENCOUNTER — Encounter: Payer: Self-pay | Admitting: Registered Nurse

## 2019-05-19 ENCOUNTER — Telehealth (INDEPENDENT_AMBULATORY_CARE_PROVIDER_SITE_OTHER): Payer: BC Managed Care – PPO | Admitting: Registered Nurse

## 2019-05-19 VITALS — Ht 71.0 in | Wt 200.0 lb

## 2019-05-19 DIAGNOSIS — R0602 Shortness of breath: Secondary | ICD-10-CM

## 2019-05-19 MED ORDER — AZITHROMYCIN 250 MG PO TABS: ORAL_TABLET | ORAL | 0 refills | Status: DC

## 2019-05-19 NOTE — Progress Notes (Signed)
Telemedicine Encounter- SOAP NOTE Established Patient  This telephone encounter was conducted with the patient's (or proxy's) verbal consent via audio telecommunications: yes  Patient was instructed to have this encounter in a suitably private space; and to only have persons present to whom they give permission to participate. In addition, patient identity was confirmed by use of name plus two identifiers (DOB and address).  I discussed the limitations, risks, security and privacy concerns of performing an evaluation and management service by telephone and the availability of in person appointments. I also discussed with the patient that there may be a patient responsible charge related to this service. The patient expressed understanding and agreed to proceed.  I spent a total of 11 minutes talking with the patient or their proxy.  Chief Complaint  Patient presents with  . Follow-up    pt stated--still having shortness of breath, constipation-due to COVID--- more than 1 month. Pt tested at the job site more than 1 month ago    Subjective   Jackson Mahoney is a 53 y.o. established patient. Telephone visit today for ongoing COVID symptoms  HPI Pt tested positive for COVID around 1 mo prior to today's visit. Reports lingering symptoms, including cough, shob, and sputum production. Otherwise, most of his symptoms have started to improve.  Patient Active Problem List   Diagnosis Date Noted  . Dyslipidemia 11/22/2017  . CRI (chronic renal insufficiency), stage 2 (mild) 08/08/2016  . Microalbuminuria due to type 2 diabetes mellitus ([place]) 08/08/2016  . Uncontrolled type 2 diabetes mellitus ([place]) 08/08/2016  . Diabetes mellitus without complication ([place]) 83/66/2947  . Essential hypertension 07/25/2012  . Mixed hyperlipidemia 07/25/2012    Past Medical History:  Diagnosis Date  . Allergy   . Diabetes mellitus without complication ([place])   . Hyperlipidemia   . Hypertension      Current Outpatient Medications  Medication Sig Dispense Refill  . ACCU-CHEK FASTCLIX LANCETS MISC USE UTD  0  . albuterol (VENTOLIN HFA) 108 (90 Base) MCG/ACT inhaler Inhale 2 puffs into the lungs every 6 (six) hours as needed for wheezing or shortness of breath. 18 g 1  . aspirin 81 MG EC tablet Take 81 mg by mouth daily. Swallow whole.    Marland Kitchen atorvastatin (LIPITOR) 40 MG tablet TAKE 1 TABLET(40 MG) BY MOUTH DAILY 30 tablet 0  . benzonatate (TESSALON) 100 MG capsule Take 1 capsule (100 mg total) by mouth 2 (two) times daily as needed for cough. 20 capsule 3  . famciclovir (FAMVIR) 500 MG tablet TK 1 T PO BID FOR 5 DAYS  5  . FARXIGA 10 MG TABS tablet TAKE 1 TABLET BY MOUTH DAILY 90 tablet 0  . glucose blood test strip Use as instructed  Diagnosis Code E11.9 100 each 12  . Insulin Pen Needle (BD PEN NEEDLE NANO U/F) 32G X 4 MM MISC USE WITH INSULIN EVERY EVENING 100 each 2  . Insulin Syringe-Needle U-100 (B-D INSULIN SYRINGE) 31G X 5/16" 0.3 ML MISC Use to administer insulin daily at bedtime 60 each 3  . LANTUS SOLOSTAR 100 UNIT/ML Solostar Pen INJECT 20 UNITS INTO THE SKIN DAILY AT 10 PM 30 mL 0  . lisinopril (ZESTRIL) 40 MG tablet Take 1 tablet (40 mg total) by mouth daily. 90 tablet 0  . metFORMIN (GLUCOPHAGE) 500 MG tablet Take 1 tablet (500 mg total) by mouth 3 (three) times daily. 90 tablet 0  . zolpidem (AMBIEN) 10 MG tablet TK 1 T PO HS PRN  2  . azithromycin (ZITHROMAX) 250 MG tablet Take 2 tabs on first day. Then take 1 daily. Finish entire supply. 6 tablet 0  . glucose monitoring kit (FREESTYLE) monitoring kit 1 each by Does not apply route as needed for other. 1 each 0  . insulin glargine (LANTUS) 100 UNIT/ML injection Inject 0.2 mLs (20 Units total) into the skin at bedtime. 10 mL 3   Current Facility-Administered Medications  Medication Dose Route Frequency Provider Last Rate Last Dose  . 0.9 %  sodium chloride infusion  500 mL Intravenous Once Cirigliano, Vito V, DO         Allergies  Allergen Reactions  . Wasp Venom Anaphylaxis  . Chloroquine Itching    Social History   Socioeconomic History  . Marital status: Single    Spouse name: Not on file  . Number of children: Not on file  . Years of education: Not on file  . Highest education level: Not on file  Occupational History  . Not on file  Social Needs  . Financial resource strain: Not on file  . Food insecurity    Worry: Not on file    Inability: Not on file  . Transportation needs    Medical: Not on file    Non-medical: Not on file  Tobacco Use  . Smoking status: Never Smoker  . Smokeless tobacco: Never Used  Substance and Sexual Activity  . Alcohol use: Yes    Alcohol/week: 2.0 standard drinks    Types: 2 Cans of beer per week  . Drug use: No  . Sexual activity: Never  Lifestyle  . Physical activity    Days per week: Not on file    Minutes per session: Not on file  . Stress: Not on file  Relationships  . Social Herbalist on phone: Not on file    Gets together: Not on file    Attends religious service: Not on file    Active member of club or organization: Not on file    Attends meetings of clubs or organizations: Not on file    Relationship status: Not on file  . Intimate partner violence    Fear of current or ex partner: Not on file    Emotionally abused: Not on file    Physically abused: Not on file    Forced sexual activity: Not on file  Other Topics Concern  . Not on file  Social History Narrative  . Not on file    Review of Systems  Constitutional: Negative.   HENT: Negative.   Eyes: Negative.   Respiratory: Positive for cough, sputum production and shortness of breath. Negative for hemoptysis and wheezing.   Cardiovascular: Negative.   Gastrointestinal: Negative.   Genitourinary: Negative.   Musculoskeletal: Negative.   Skin: Negative.   Neurological: Negative.   Endo/Heme/Allergies: Negative.   Psychiatric/Behavioral: Negative.   All other  systems reviewed and are negative.   Objective   Vitals as reported by the patient: Today's Vitals   05/19/19 1321  Weight: 200 lb (90.7 kg)  Height: 5' 11"  (1.803 m)    [name] was seen today for follow-up.  Diagnoses and all orders for this visit:  Shortness of breath -     azithromycin (ZITHROMAX) 250 MG tablet; Take 2 tabs on first day. Then take 1 daily. Finish entire supply.   PLAN  I have concern for this turning into CAP - will treat with azithromycin 265m 2 tabs on first day  then once daily  Continue using albuterol and fluticasone spray  Given ED precautions, pt demonstrates understanding  Patient encouraged to call clinic with any questions, comments, or concerns.   I discussed the assessment and treatment plan with the patient. The patient was provided an opportunity to ask questions and all were answered. The patient agreed with the plan and demonstrated an understanding of the instructions.   The patient was advised to call back or seek an in-person evaluation if the symptoms worsen or if the condition fails to improve as anticipated.  I provided 11 minutes of non-face-to-face time during this encounter.  Maximiano Coss, NP  Primary Care at [facility]

## 2019-05-20 ENCOUNTER — Telehealth: Payer: Self-pay | Admitting: Family Medicine

## 2019-05-20 NOTE — Telephone Encounter (Signed)
Patient called to have his  return to work excuse note faxed to attention Olivia Mackie  [phone]  FR

## 2019-05-21 NOTE — Telephone Encounter (Signed)
I routed the letter to the lab pool.  Please print and fax.  Thank you.

## 2019-05-21 NOTE — Telephone Encounter (Signed)
SENT LETTER VIA FAX TO [phone].  PT NOTIFIED VIA PHONE AND AGREEABLE. DGADDY, CMA

## 2019-05-25 ENCOUNTER — Other Ambulatory Visit: Payer: Self-pay

## 2019-05-25 ENCOUNTER — Telehealth (INDEPENDENT_AMBULATORY_CARE_PROVIDER_SITE_OTHER): Payer: BC Managed Care – PPO | Admitting: Registered Nurse

## 2019-05-25 ENCOUNTER — Encounter: Payer: Self-pay | Admitting: Registered Nurse

## 2019-05-25 DIAGNOSIS — E1165 Type 2 diabetes mellitus with hyperglycemia: Secondary | ICD-10-CM | POA: Diagnosis not present

## 2019-05-25 MED ORDER — ATORVASTATIN CALCIUM 40 MG PO TABS: ORAL_TABLET | ORAL | 3 refills | Status: DC

## 2019-05-25 MED ORDER — GLUCOSE BLOOD VI STRP: ORAL_STRIP | 12 refills | Status: AC

## 2019-05-25 MED ORDER — INSULIN GLARGINE 100 UNIT/ML ~~LOC~~ SOLN: 20.0000 [IU] | Freq: Every day | SUBCUTANEOUS | 3 refills | Status: DC

## 2019-05-25 MED ORDER — FARXIGA 10 MG PO TABS: 10.0000 mg | ORAL_TABLET | Freq: Every day | ORAL | 0 refills | Status: DC

## 2019-05-25 NOTE — Progress Notes (Signed)
  Telemedicine Encounter- SOAP NOTE Established Patient  This telephone encounter was conducted with the patient's (or proxy's) verbal consent via audio telecommunications: yes  Patient was instructed to have this encounter in a suitably private space; and to only have persons present to whom they give permission to participate. In addition, patient identity was confirmed by use of name plus two identifiers (DOB and address).  I discussed the limitations, risks, security and privacy concerns of performing an evaluation and management service by telephone and the availability of in person appointments. I also discussed with the patient that there may be a patient responsible charge related to this service. The patient expressed understanding and agreed to proceed.  I spent a total of 13 minutes talking with the patient or their proxy.  Chief Complaint  Patient presents with  . Follow-up    Pt stated ---feeling much better . Denied fever.    Subjective   Jackson Mahoney is a 53 y.o. established patient. Telephone visit today for return to work note  HPI Pt reports he has waited until 72 after his last COVID symptoms and feels he is ready to return to work. He has not had a fever and has not taken antipyretics.  Additionally, requests medication refills of his farxiga and atorvastatin - discussed that he needs to schedule with his PCP for a T2DM follow up.   Patient Active Problem List   Diagnosis Date Noted  . Dyslipidemia 11/22/2017  . CRI (chronic renal insufficiency), stage 2 (mild) 08/08/2016  . Microalbuminuria due to type 2 diabetes mellitus ([facility]) 08/08/2016  . Uncontrolled type 2 diabetes mellitus ([facility]) 08/08/2016  . Diabetes mellitus without complication ([facility]) 07/25/2012  . Essential hypertension 07/25/2012  . Mixed hyperlipidemia 07/25/2012    Past Medical History:  Diagnosis Date  . Allergy   . Diabetes mellitus without complication ([facility])   . Hyperlipidemia   .  Hypertension     Current Outpatient Medications  Medication Sig Dispense Refill  . ACCU-CHEK FASTCLIX LANCETS MISC USE UTD  0  . albuterol (VENTOLIN HFA) 108 (90 Base) MCG/ACT inhaler Inhale 2 puffs into the lungs every 6 (six) hours as needed for wheezing or shortness of breath. 18 g 1  . aspirin 81 MG EC tablet Take 81 mg by mouth daily. Swallow whole.    . atorvastatin (LIPITOR) 40 MG tablet TAKE 1 TABLET(40 MG) BY MOUTH DAILY 90 tablet 3  . dapagliflozin propanediol (FARXIGA) 10 MG TABS tablet Take 10 mg by mouth daily. 90 tablet 0  . famciclovir (FAMVIR) 500 MG tablet TK 1 T PO BID FOR 5 DAYS  5  . glucose blood test strip Use as instructed  Diagnosis Code E11.9 100 each 12  . Insulin Pen Needle (BD PEN NEEDLE NANO U/F) 32G X 4 MM MISC USE WITH INSULIN EVERY EVENING 100 each 2  . Insulin Syringe-Needle U-100 (B-D INSULIN SYRINGE) 31G X 5/16" 0.3 ML MISC Use to administer insulin daily at bedtime 60 each 3  . LANTUS SOLOSTAR 100 UNIT/ML Solostar Pen INJECT 20 UNITS INTO THE SKIN DAILY AT 10 PM 30 mL 0  . lisinopril (ZESTRIL) 40 MG tablet Take 1 tablet (40 mg total) by mouth daily. 90 tablet 0  . metFORMIN (GLUCOPHAGE) 500 MG tablet Take 1 tablet (500 mg total) by mouth 3 (three) times daily. 90 tablet 0  . zolpidem (AMBIEN) 10 MG tablet TK 1 T PO HS PRN  2  . glucose monitoring kit (FREESTYLE) monitoring kit 1 each   by Does not apply route as needed for other. 1 each 0  . insulin glargine (LANTUS) 100 UNIT/ML injection Inject 0.2 mLs (20 Units total) into the skin at bedtime. 10 mL 3   Current Facility-Administered Medications  Medication Dose Route Frequency Provider Last Rate Last Admin  . 0.9 %  sodium chloride infusion  500 mL Intravenous Once Cirigliano, Vito V, DO        Allergies  Allergen Reactions  . Wasp Venom Anaphylaxis  . Chloroquine Itching    Social History   Socioeconomic History  . Marital status: Single    Spouse name: Not on file  . Number of children: Not  on file  . Years of education: Not on file  . Highest education level: Not on file  Occupational History  . Not on file  Tobacco Use  . Smoking status: Never Smoker  . Smokeless tobacco: Never Used  Substance and Sexual Activity  . Alcohol use: Yes    Alcohol/week: 2.0 standard drinks    Types: 2 Cans of beer per week  . Drug use: No  . Sexual activity: Never  Other Topics Concern  . Not on file  Social History Narrative  . Not on file   Social Determinants of Health   Financial Resource Strain:   . Difficulty of Paying Living Expenses: Not on file  Food Insecurity:   . Worried About Running Out of Food in the Last Year: Not on file  . Ran Out of Food in the Last Year: Not on file  Transportation Needs:   . Lack of Transportation (Medical): Not on file  . Lack of Transportation (Non-Medical): Not on file  Physical Activity:   . Days of Exercise per Week: Not on file  . Minutes of Exercise per Session: Not on file  Stress:   . Feeling of Stress : Not on file  Social Connections:   . Frequency of Communication with Friends and Family: Not on file  . Frequency of Social Gatherings with Friends and Family: Not on file  . Attends Religious Services: Not on file  . Active Member of Clubs or Organizations: Not on file  . Attends Club or Organization Meetings: Not on file  . Marital Status: Not on file  Intimate Partner Violence:   . Fear of Current or Ex-Partner: Not on file  . Emotionally Abused: Not on file  . Physically Abused: Not on file  . Sexually Abused: Not on file    Review of Systems  Constitutional: Negative.   HENT: Negative.   Eyes: Negative.   Respiratory: Negative.   Cardiovascular: Negative.   Genitourinary: Negative.   Musculoskeletal: Negative.   Skin: Negative.   Neurological: Negative.   Endo/Heme/Allergies: Negative.   Psychiatric/Behavioral: Negative.   All other systems reviewed and are negative.   Objective   Vitals as reported by the  patient: Today's Vitals   05/25/19 1516  Weight: 205 lb (93 kg)  Height: 5' 11" (1.803 m)    Jackson Mahoney was seen today for follow-up.  Diagnoses and all orders for this visit:  Uncontrolled type 2 diabetes mellitus with hyperglycemia (HCC) -     insulin glargine (LANTUS) 100 UNIT/ML injection; Inject 0.2 mLs (20 Units total) into the skin at bedtime. -     dapagliflozin propanediol (FARXIGA) 10 MG TABS tablet; Take 10 mg by mouth daily. -     atorvastatin (LIPITOR) 40 MG tablet; TAKE 1 TABLET(40 MG) BY MOUTH DAILY -       glucose blood test strip; Use as instructed  Diagnosis Code E11.9   PLAN  Return to work note written  Refills provided  Pt to schedule follow up with PCP  Patient encouraged to call clinic with any questions, comments, or concerns.   I discussed the assessment and treatment plan with the patient. The patient was provided an opportunity to ask questions and all were answered. The patient agreed with the plan and demonstrated an understanding of the instructions.   The patient was advised to call back or seek an in-person evaluation if the symptoms worsen or if the condition fails to improve as anticipated.  I provided 13 minutes of non-face-to-face time during this encounter.  Maximiano Coss, NP  Primary Care at [facility]

## 2019-05-26 ENCOUNTER — Telehealth: Payer: Self-pay

## 2019-05-26 NOTE — Telephone Encounter (Signed)
Pt. Called requesting a return to work note be faxed to his work. Fax: [phone], attnOlivia Mackie.

## 2019-05-29 ENCOUNTER — Telehealth: Payer: Self-pay | Admitting: Family Medicine

## 2019-05-29 NOTE — Telephone Encounter (Signed)
Copied from [place] [id number]. Topic: Quick Communication - Rx Refill/Question >> May 29, 2019 12:54 PM Erick Blinks wrote: Pharmacy called needed verbal confirmation for Rx sent in today please advise  [facility] [place] [address] Phone: [phone] Fax: [phone]  For the Insulin Pen

## 2019-06-02 NOTE — Telephone Encounter (Signed)
Letter sent via fax to attn: tracy at [phone].

## 2019-06-03 NOTE — Telephone Encounter (Signed)
Rx clarified and he can have the pens if that is what he has been getting. Pharmacy stated understanding.

## 2019-06-23 ENCOUNTER — Other Ambulatory Visit: Payer: Self-pay | Admitting: Family Medicine

## 2019-06-23 DIAGNOSIS — E1165 Type 2 diabetes mellitus with hyperglycemia: Secondary | ICD-10-CM

## 2019-06-25 DIAGNOSIS — Z125 Encounter for screening for malignant neoplasm of prostate: Secondary | ICD-10-CM | POA: Diagnosis not present

## 2019-06-25 DIAGNOSIS — G4700 Insomnia, unspecified: Secondary | ICD-10-CM | POA: Diagnosis not present

## 2019-06-25 DIAGNOSIS — E7849 Other hyperlipidemia: Secondary | ICD-10-CM | POA: Diagnosis not present

## 2019-06-25 DIAGNOSIS — E119 Type 2 diabetes mellitus without complications: Secondary | ICD-10-CM | POA: Diagnosis not present

## 2019-06-25 DIAGNOSIS — Z0000 Encounter for general adult medical examination without abnormal findings: Secondary | ICD-10-CM | POA: Diagnosis not present

## 2019-06-29 DIAGNOSIS — E119 Type 2 diabetes mellitus without complications: Secondary | ICD-10-CM | POA: Diagnosis not present

## 2019-06-29 DIAGNOSIS — E7849 Other hyperlipidemia: Secondary | ICD-10-CM | POA: Diagnosis not present

## 2019-06-29 DIAGNOSIS — Z125 Encounter for screening for malignant neoplasm of prostate: Secondary | ICD-10-CM | POA: Diagnosis not present

## 2019-06-29 DIAGNOSIS — Z79899 Other long term (current) drug therapy: Secondary | ICD-10-CM | POA: Diagnosis not present

## 2019-06-29 DIAGNOSIS — Z0000 Encounter for general adult medical examination without abnormal findings: Secondary | ICD-10-CM | POA: Diagnosis not present

## 2019-06-29 DIAGNOSIS — E559 Vitamin D deficiency, unspecified: Secondary | ICD-10-CM | POA: Diagnosis not present

## 2019-08-24 ENCOUNTER — Encounter (HOSPITAL_COMMUNITY): Payer: Self-pay | Admitting: Emergency Medicine

## 2019-08-24 ENCOUNTER — Emergency Department (HOSPITAL_COMMUNITY): Payer: BC Managed Care – PPO | Attending: Student

## 2019-08-24 ENCOUNTER — Emergency Department (HOSPITAL_COMMUNITY)
Admission: EM | Admit: 2019-08-24 | Discharge: 2019-08-24 | Disposition: A | Discharge status: Home / Self Care | Payer: Worker's Compensation | Attending: Emergency Medicine | Admitting: Emergency Medicine

## 2019-08-24 ENCOUNTER — Other Ambulatory Visit: Payer: Self-pay

## 2019-08-24 DIAGNOSIS — Z79899 Other long term (current) drug therapy: Secondary | ICD-10-CM | POA: Insufficient documentation

## 2019-08-24 DIAGNOSIS — Y9389 Activity, other specified: Secondary | ICD-10-CM | POA: Diagnosis not present

## 2019-08-24 DIAGNOSIS — Y929 Unspecified place or not applicable: Secondary | ICD-10-CM | POA: Insufficient documentation

## 2019-08-24 DIAGNOSIS — E1122 Type 2 diabetes mellitus with diabetic chronic kidney disease: Secondary | ICD-10-CM | POA: Diagnosis not present

## 2019-08-24 DIAGNOSIS — I129 Hypertensive chronic kidney disease with stage 1 through stage 4 chronic kidney disease, or unspecified chronic kidney disease: Secondary | ICD-10-CM | POA: Diagnosis not present

## 2019-08-24 DIAGNOSIS — N182 Chronic kidney disease, stage 2 (mild): Secondary | ICD-10-CM | POA: Insufficient documentation

## 2019-08-24 DIAGNOSIS — Z7982 Long term (current) use of aspirin: Secondary | ICD-10-CM | POA: Diagnosis not present

## 2019-08-24 DIAGNOSIS — S51812A Laceration without foreign body of left forearm, initial encounter: Secondary | ICD-10-CM | POA: Insufficient documentation

## 2019-08-24 DIAGNOSIS — Y999 Unspecified external cause status: Secondary | ICD-10-CM | POA: Diagnosis not present

## 2019-08-24 DIAGNOSIS — W25XXXA Contact with sharp glass, initial encounter: Secondary | ICD-10-CM | POA: Diagnosis not present

## 2019-08-24 DIAGNOSIS — Z794 Long term (current) use of insulin: Secondary | ICD-10-CM | POA: Insufficient documentation

## 2019-08-24 MED ORDER — CEPHALEXIN 500 MG PO CAPS: 500.0000 mg | ORAL_CAPSULE | Freq: Four times a day (QID) | ORAL | 0 refills | Status: DC

## 2019-08-24 MED ORDER — LIDOCAINE-EPINEPHRINE (PF) 2 %-1:200000 IJ SOLN: 10.0000 mL | Freq: Once | INTRAMUSCULAR | Status: DC

## 2019-08-24 MED ORDER — OXYCODONE-ACETAMINOPHEN 5-325 MG PO TABS: 1.0000 | ORAL_TABLET | Freq: Four times a day (QID) | ORAL | 0 refills | Status: DC | PRN

## 2019-08-24 MED ORDER — METHOCARBAMOL 500 MG PO TABS: 1000.0000 mg | ORAL_TABLET | Freq: Four times a day (QID) | ORAL | 0 refills | Status: DC

## 2019-08-24 MED ORDER — CEPHALEXIN 250 MG PO CAPS
500.0000 mg | ORAL_CAPSULE | Freq: Once | ORAL | Status: AC
  Administered 2019-08-24: 18:00:00 500 mg via ORAL
  Filled 2019-08-24: qty 2

## 2019-08-24 MED ORDER — BACITRACIN ZINC 500 UNIT/GM EX OINT
TOPICAL_OINTMENT | Freq: Once | CUTANEOUS | Status: AC
  Administered 2019-08-24: 1 via TOPICAL
  Filled 2019-08-24: qty 0.9

## 2019-08-24 NOTE — Discharge Instructions (Signed)
Sutures will need to be removed in 7-10 days. Keep wound clean dry and covered, you can shower but do not soak the arm in water, no baths, swimming or doing the dishes until wound is healed and sutures have been removed. Take antibiotics to help prevent infection. Monitor for any redness, swelling, warmth or drainage, if this occurs return to the ED.

## 2019-08-24 NOTE — ED Provider Notes (Signed)
[facility] EMERGENCY DEPARTMENT Provider Note   CSN: [id number] Arrival date & time: 08/24/19  1308     History Chief Complaint  Patient presents with  . Laceration    Jackson Mahoney is a 54 y.o. male.  Jackson Mahoney is a 54 y.o. male with a history of hypertension, hyperlipidemia, diabetes and seasonal allergies, who presents to the emergency department for evaluation of laceration to the left forearm.  He reports this occurred just prior to arrival as he was carrying out a bag of trash and a piece of glass from the bag cut his arm.  He reports bleeding was well controlled with a bandage.  He denies any numbness tingling or weakness in the arm.  He is able to move the arm without difficulty.  Patient reports minimal pain over the laceration.  Tetanus updated 3-4 years ago.  Patient does state that he is diabetic and his sugars run in the 150s usually.  No other aggravating or alleviating factors.        Past Medical History:  Diagnosis Date  . Allergy   . Diabetes mellitus without complication ([place])   . Hyperlipidemia   . Hypertension     Patient Active Problem List   Diagnosis Date Noted  . Dyslipidemia 11/22/2017  . CRI (chronic renal insufficiency), stage 2 (mild) 08/08/2016  . Microalbuminuria due to type 2 diabetes mellitus ([place]) 08/08/2016  . Uncontrolled type 2 diabetes mellitus ([place]) 08/08/2016  . Diabetes mellitus without complication ([place]) 25/95/6387  . Essential hypertension 07/25/2012  . Mixed hyperlipidemia 07/25/2012    Past Surgical History:  Procedure Laterality Date  . NO PAST SURGERIES         Family History  Problem Relation Age of Onset  . Diabetes Father   . Colon cancer Neg Hx   . Colon polyps Neg Hx   . Rectal cancer Neg Hx   . Stomach cancer Neg Hx   . Esophageal cancer Neg Hx     Social History   Tobacco Use  . Smoking status: Never Smoker  . Smokeless tobacco: Never Used  Substance Use Topics  . Alcohol use:  Yes    Alcohol/week: 2.0 standard drinks    Types: 2 Cans of beer per week  . Drug use: No    Home Medications Prior to Admission medications   Medication Sig Start Date End Date Taking? Authorizing Provider  ACCU-CHEK FASTCLIX LANCETS MISC USE UTD 11/22/17   [provider]  albuterol (VENTOLIN HFA) 108 (90 Base) MCG/ACT inhaler Inhale 2 puffs into the lungs every 6 (six) hours as needed for wheezing or shortness of breath. 04/22/19   Maximiano Coss, NP  aspirin 81 MG EC tablet Take 81 mg by mouth daily. Swallow whole.    [provider]  atorvastatin (LIPITOR) 40 MG tablet TAKE 1 TABLET(40 MG) BY MOUTH DAILY 05/25/19   Maximiano Coss, NP  cephALEXin (KEFLEX) 500 MG capsule Take 1 capsule (500 mg total) by mouth 4 (four) times daily. 08/24/19   Jacqlyn Larsen, PA-C  dapagliflozin propanediol (FARXIGA) 10 MG TABS tablet Take 10 mg by mouth daily. 05/25/19   Maximiano Coss, NP  famciclovir (FAMVIR) 500 MG tablet TK 1 T PO BID FOR 5 DAYS 02/07/18   [provider]  glucose blood test strip Use as instructed  Diagnosis Code E11.9 05/25/19   Maximiano Coss, NP  glucose monitoring kit (FREESTYLE) monitoring kit 1 each by Does not apply route as needed for other. 07/25/12  02/24/18  Posey Boyer, MD  insulin glargine (LANTUS) 100 UNIT/ML injection Inject 0.2 mLs (20 Units total) into the skin at bedtime. 05/25/19 05/24/20  Maximiano Coss, NP  Insulin Pen Needle (BD PEN NEEDLE NANO U/F) 32G X 4 MM MISC USE WITH INSULIN EVERY EVENING 02/27/19   Delia Chimes A, MD  Insulin Syringe-Needle U-100 (B-D INSULIN SYRINGE) 31G X 5/16" 0.3 ML MISC Use to administer insulin daily at bedtime 08/08/16   Delia Chimes A, MD  LANTUS SOLOSTAR 100 UNIT/ML Solostar Pen INJECT 20 UNITS INTO THE SKIN DAILY AT 10 PM 02/27/19   Delia Chimes A, MD  lisinopril (ZESTRIL) 40 MG tablet Take 1 tablet (40 mg total) by mouth daily. 05/05/19   Forrest Moron, MD  metFORMIN (GLUCOPHAGE) 500 MG tablet  TAKE 1 TABLET(500 MG) BY MOUTH THREE TIMES DAILY 06/23/19   Forrest Moron, MD  methocarbamol (ROBAXIN) 500 MG tablet Take 2 tablets (1,000 mg total) by mouth 4 (four) times daily. 08/24/19   Carlisle Cater, PA-C  oxyCODONE-acetaminophen (PERCOCET/ROXICET) 5-325 MG tablet Take 1 tablet by mouth every 6 (six) hours as needed for severe pain. 08/24/19   Carlisle Cater, PA-C  zolpidem (AMBIEN) 10 MG tablet TK 1 T PO HS PRN 01/24/18   [provider]    Allergies    Wasp venom and Chloroquine  Review of Systems   Review of Systems  Constitutional: Negative for chills and fever.  Musculoskeletal: Negative for arthralgias and myalgias.  Skin: Positive for wound.  Neurological: Negative for weakness and numbness.  All other systems reviewed and are negative.   Physical Exam Updated Vital Signs BP (!) 172/95   Pulse 88   Temp 98.2 F (36.8 C) (Oral)   Resp 16   Ht 5' 11"  (1.803 m)   Wt 102.1 kg   SpO2 100%   BMI 31.38 kg/m   Physical Exam Vitals and nursing note reviewed.  Constitutional:      General: He is not in acute distress.    Appearance: Normal appearance. He is well-developed and normal weight. He is not diaphoretic.  HENT:     Head: Normocephalic and atraumatic.  Eyes:     General:        Right eye: No discharge.        Left eye: No discharge.  Pulmonary:     Effort: Pulmonary effort is normal. No respiratory distress.  Musculoskeletal:     Comments: 4 cm laceration to the ventral left forearm, no active bleeding, subq tissue visible below but no visible tendon or muscle. 2+ radial pulse distally, normal strength and sensation (see photo below)  Skin:    General: Skin is warm and dry.  Neurological:     Mental Status: He is alert and oriented to person, place, and time.     Coordination: Coordination normal.  Psychiatric:        Mood and Affect: Mood normal.        Behavior: Behavior normal.       ED Results / Procedures / Treatments   Labs (all  labs ordered are listed, but only abnormal results are displayed) Labs Reviewed - No data to display  EKG None  Radiology DG Forearm Left  Result Date: 08/24/2019 CLINICAL DATA:  Left forearm laceration. EXAM: LEFT FOREARM - 2 VIEW COMPARISON:  None. FINDINGS: There is no evidence of fracture or other focal bone lesions. Soft tissue laceration is seen involving volar region. No radiopaque foreign body is noted. IMPRESSION:  No fracture or dislocation is noted. No radiopaque foreign body is noted. Electronically Signed   By: Marijo Conception M.D.   On: 08/24/2019 16:44    Procedures .Marland KitchenLaceration Repair  Date/Time: 08/24/2019 5:19 PM Performed by: Jacqlyn Larsen, PA-C Authorized by: Jacqlyn Larsen, PA-C   Consent:    Consent obtained:  Verbal   Consent given by:  Patient   Risks discussed:  Infection, pain, poor cosmetic result, poor wound healing and nerve damage   Alternatives discussed:  No treatment Anesthesia (see MAR for exact dosages):    Anesthesia method:  Local infiltration   Local anesthetic:  Lidocaine 2% WITH epi Laceration details:    Location:  Shoulder/arm   Shoulder/arm location:  L lower arm   Length (cm):  4   Depth (mm):  2 Repair type:    Repair type:  Simple Pre-procedure details:    Preparation:  Imaging obtained to evaluate for foreign bodies and patient was prepped and draped in usual sterile fashion Exploration:    Hemostasis achieved with:  Direct pressure and epinephrine Treatment:    Area cleansed with:  Saline   Amount of cleaning:  Extensive   Irrigation solution:  Sterile saline   Irrigation volume:  500 ml   Irrigation method:  Pressure wash Skin repair:    Repair method:  Sutures   Suture size:  4-0   Suture material:  Prolene   Suture technique:  Simple interrupted   Number of sutures:  5 Approximation:    Approximation:  Close Post-procedure details:    Dressing:  Antibiotic ointment and adhesive bandage   Patient tolerance of  procedure:  Tolerated well, no immediate complications   (including critical care time)  Medications Ordered in ED Medications  lidocaine-EPINEPHrine (XYLOCAINE W/EPI) 2 %-1:200000 (PF) injection 10 mL (has no administration in time range)  bacitracin ointment (has no administration in time range)  cephALEXin (KEFLEX) capsule 500 mg (has no administration in time range)    ED Course  I have reviewed the triage vital signs and the nursing notes.  Pertinent labs & imaging results that were available during my care of the patient were reviewed by me and considered in my medical decision making (see chart for details).    MDM Rules/Calculators/A&P                      Pressure irrigation performed. Wound explored and base of wound visualized in a bloodless field without evidence of foreign body.  Laceration  Repair was well tolerated. Tdap up to date.  Pt has diabetes which may effect normal wound healing, and wound was contaminated. Pt discharged with antibiotics.  Discussed suture home care with patient and answered questions. Pt to follow-up for wound check and suture removal in 7 days; they are to return to the ED sooner for signs of infection. Pt is hemodynamically stable with no complaints prior to dc.   Final Clinical Impression(s) / ED Diagnoses Final diagnoses:  Laceration of left forearm, initial encounter    Rx / DC Orders ED Discharge Orders         Ordered    cephALEXin (KEFLEX) 500 MG capsule  4 times daily     08/24/19 1734           Jacqlyn Larsen, [place] 08/24/19 [place], [place], DO 08/24/19 1758

## 2019-08-24 NOTE — ED Triage Notes (Signed)
Onset today picking up a bag of trash and a piece of glass hit left forearm. Laceration bandaged prior to arrival and intact. Radial pulse +2 full sensation and able to move all fingers equally.

## 2019-08-27 ENCOUNTER — Other Ambulatory Visit: Payer: Self-pay | Admitting: Family Medicine

## 2019-08-27 ENCOUNTER — Other Ambulatory Visit: Payer: Self-pay

## 2019-08-27 DIAGNOSIS — I10 Essential (primary) hypertension: Secondary | ICD-10-CM

## 2019-08-27 MED ORDER — LISINOPRIL 40 MG PO TABS: 40.0000 mg | ORAL_TABLET | Freq: Every day | ORAL | 0 refills | Status: DC

## 2019-09-25 ENCOUNTER — Other Ambulatory Visit: Payer: Self-pay | Admitting: Family Medicine

## 2019-09-25 DIAGNOSIS — E119 Type 2 diabetes mellitus without complications: Secondary | ICD-10-CM | POA: Diagnosis not present

## 2019-09-25 DIAGNOSIS — E1165 Type 2 diabetes mellitus with hyperglycemia: Secondary | ICD-10-CM

## 2019-09-25 DIAGNOSIS — E7849 Other hyperlipidemia: Secondary | ICD-10-CM | POA: Diagnosis not present

## 2019-09-25 DIAGNOSIS — G4700 Insomnia, unspecified: Secondary | ICD-10-CM | POA: Diagnosis not present

## 2019-09-25 DIAGNOSIS — I10 Essential (primary) hypertension: Secondary | ICD-10-CM

## 2019-09-25 MED ORDER — METFORMIN HCL 500 MG PO TABS: ORAL_TABLET | ORAL | 0 refills | Status: DC

## 2019-09-25 MED ORDER — INSULIN GLARGINE 100 UNIT/ML ~~LOC~~ SOLN: 20.0000 [IU] | Freq: Every day | SUBCUTANEOUS | 3 refills | Status: DC

## 2019-09-25 MED ORDER — ATORVASTATIN CALCIUM 40 MG PO TABS: ORAL_TABLET | ORAL | 2 refills | Status: DC

## 2019-09-25 NOTE — Telephone Encounter (Signed)
Medication Refill - Medication: metformin, atorvastatin, lisinopril, insulin,   Has the patient contacted their pharmacy? Yes.   (Agent: If no, request that the patient contact the pharmacy for the refill.) (Agent: If yes, when and what did the pharmacy advise?)  Preferred Pharmacy (with phone number or street name):  [id number] [place] Otto,  - [address] AT [facility] [address]  [address] [place] [address]  Phone: [phone] Fax: [phone]  Not a 24 hour pharmacy; exact hours not known.     Agent: Please be advised that RX refills may take up to 3 business days. We ask that you follow-up with your pharmacy.

## 2019-09-28 ENCOUNTER — Telehealth: Payer: Self-pay | Admitting: Family Medicine

## 2019-09-28 NOTE — Telephone Encounter (Signed)
Pharmacy called and is wanted to know if they can change pt proscription from viles to pens. Please advise. [phone]

## 2019-09-28 NOTE — Telephone Encounter (Signed)
Advised pharmacy per Creta Levin ok to change prescription from vials to pens.  #5 pens given.

## 2020-01-12 DIAGNOSIS — E7849 Other hyperlipidemia: Secondary | ICD-10-CM | POA: Diagnosis not present

## 2020-01-12 DIAGNOSIS — G4700 Insomnia, unspecified: Secondary | ICD-10-CM | POA: Diagnosis not present

## 2020-01-12 DIAGNOSIS — M5489 Other dorsalgia: Secondary | ICD-10-CM | POA: Diagnosis not present

## 2020-01-12 DIAGNOSIS — E119 Type 2 diabetes mellitus without complications: Secondary | ICD-10-CM | POA: Diagnosis not present

## 2020-02-04 ENCOUNTER — Other Ambulatory Visit: Payer: Self-pay

## 2020-02-04 ENCOUNTER — Encounter: Payer: Self-pay | Admitting: Family Medicine

## 2020-02-04 ENCOUNTER — Telehealth: Payer: Self-pay | Admitting: Registered Nurse

## 2020-02-04 ENCOUNTER — Ambulatory Visit (INDEPENDENT_AMBULATORY_CARE_PROVIDER_SITE_OTHER): Payer: BC Managed Care – PPO | Admitting: Family Medicine

## 2020-02-04 VITALS — BP 162/94 | HR 93 | Temp 98.3°F | Ht 71.0 in | Wt 208.0 lb

## 2020-02-04 DIAGNOSIS — K0889 Other specified disorders of teeth and supporting structures: Secondary | ICD-10-CM | POA: Diagnosis not present

## 2020-02-04 DIAGNOSIS — K047 Periapical abscess without sinus: Secondary | ICD-10-CM | POA: Diagnosis not present

## 2020-02-04 DIAGNOSIS — E1165 Type 2 diabetes mellitus with hyperglycemia: Secondary | ICD-10-CM

## 2020-02-04 DIAGNOSIS — I10 Essential (primary) hypertension: Secondary | ICD-10-CM | POA: Diagnosis not present

## 2020-02-04 MED ORDER — LISINOPRIL 40 MG PO TABS: ORAL_TABLET | ORAL | 0 refills | Status: DC

## 2020-02-04 MED ORDER — ATORVASTATIN CALCIUM 40 MG PO TABS: ORAL_TABLET | ORAL | 2 refills | Status: DC

## 2020-02-04 MED ORDER — BD PEN NEEDLE NANO U/F 32G X 4 MM MISC: 2 refills | Status: DC

## 2020-02-04 MED ORDER — DAPAGLIFLOZIN PROPANEDIOL 5 MG PO TABS: 5.0000 mg | ORAL_TABLET | Freq: Every day | ORAL | 5 refills | Status: DC

## 2020-02-04 MED ORDER — AMOXICILLIN 875 MG PO TABS: 875.0000 mg | ORAL_TABLET | Freq: Two times a day (BID) | ORAL | 0 refills | Status: DC

## 2020-02-04 MED ORDER — METFORMIN HCL 500 MG PO TABS: ORAL_TABLET | ORAL | 0 refills | Status: DC

## 2020-02-04 MED ORDER — INSULIN GLARGINE 100 UNIT/ML ~~LOC~~ SOLN: 20.0000 [IU] | Freq: Every day | SUBCUTANEOUS | 3 refills | Status: DC

## 2020-02-04 MED ORDER — HYDROCODONE-ACETAMINOPHEN 5-325 MG PO TABS: ORAL_TABLET | ORAL | 0 refills | Status: DC

## 2020-02-04 NOTE — Patient Instructions (Signed)
Take Amoxicillin twice daily for infection  Take hydrocodone only if needed for severe pain  Decrease farixiga to 5 mg daily  See a doctor back in 3 months  Record sugars and bring in your record  See a dentist soon

## 2020-02-04 NOTE — Progress Notes (Signed)
Patient ID: Jackson Mahoney, male    DOB: [birth year]  Age: 54 y.o. MRN: [id number]  Chief Complaint  Patient presents with  . R jaw toothache    x 1 week- requests abx  . Diabetes    med refills     Subjective:  Patient comes in with a tooth hurting him in the right jaw area.  He has had some dental problems in the past.  He has not been to the dentist recently.  He is a diabetic, has run out some of his medicines including his blood pressure medicine.  He has a history of having had a very high A1c of 14 last time he had blood work done.  However sometimes of late he has had some hypoglycemic spells.  He takes Metformin 3 times daily, Farixiga, and Lantus.  He is out of the lisinopril.  Sugars usually run around 200, sometimes around 150 in the morning.  He is a Education officer, environmental and also works a job.  Current allergies, medications, problem list, past/family and social histories reviewed.  Objective:  BP (!) 162/94   Pulse 93   Temp 98.3 F (36.8 C)   Ht 5\' 11"  (1.803 m)   Wt 208 lb (94.3 kg)   SpO2 97%   BMI 29.01 kg/m   Right lower molar at the back of his teeth, possibly a wisdom tooth, has some redness right around it.  Neck there.  No nodes could be palpated but he was tender at the jaw also.  Chest clear.  Heart regular.  Assessment & Plan:   Assessment: 1. Uncontrolled type 2 diabetes mellitus with hyperglycemia (HCC)   2. Essential hypertension   3. Dental abscess   4. Tooth pain       Plan: Emphasized the need for him to monitor his diabetes closely and follow-up regularly.  Orders Placed This Encounter  Procedures  . Hemoglobin A1c  . Comprehensive metabolic panel  . POCT glycosylated hemoglobin (Hb A1C)    Meds ordered this encounter  Medications  . atorvastatin (LIPITOR) 40 MG tablet    Sig: TAKE 1 TABLET(40 MG) BY MOUTH DAILY    Dispense:  30 tablet    Refill:  2    Office visit needed for additional refills  . dapagliflozin propanediol (FARXIGA) 5 MG TABS  tablet    Sig: Take 1 tablet (5 mg total) by mouth daily.    Dispense:  30 tablet    Refill:  5  . insulin glargine (LANTUS) 100 UNIT/ML injection    Sig: Inject 0.2 mLs (20 Units total) into the skin at bedtime.    Dispense:  10 mL    Refill:  3  . lisinopril (ZESTRIL) 40 MG tablet    Sig: TAKE 1 TABLET(40 MG) BY MOUTH DAILY    Dispense:  90 tablet    Refill:  0    **Patient requests 90 days supply**  . metFORMIN (GLUCOPHAGE) 500 MG tablet    Sig: TAKE 1 TABLET(500 MG) BY MOUTH THREE TIMES DAILY    Dispense:  90 tablet    Refill:  0  . Insulin Pen Needle (BD PEN NEEDLE NANO U/F) 32G X 4 MM MISC    Sig: USE WITH INSULIN EVERY EVENING    Dispense:  100 each    Refill:  2  . HYDROcodone-acetaminophen (NORCO) 5-325 MG tablet    Sig: One every 6 hours if needed for pain    Dispense:  20 tablet    Refill:  0  . amoxicillin (AMOXIL) 875 MG tablet    Sig: Take 1 tablet (875 mg total) by mouth 2 (two) times daily.    Dispense:  20 tablet    Refill:  0         Patient Instructions  Take Amoxicillin twice daily for infection  Take hydrocodone only if needed for severe pain  Decrease farixiga to 5 mg daily  See a doctor back in 3 months  Record sugars and bring in your record  See a dentist soon    Return in about 3 months (around 05/06/2020), or Kateri Plummer, for Diabetes.   Janace Hoard, MD 02/04/2020

## 2020-02-04 NOTE — Telephone Encounter (Signed)
Pt called to report that he may have missed a call from our office

## 2020-02-05 ENCOUNTER — Telehealth: Payer: Self-pay | Admitting: Registered Nurse

## 2020-02-05 LAB — COMPREHENSIVE METABOLIC PANEL
ALT: 12 IU/L (ref 0–44)
AST: 20 IU/L (ref 0–40)
Albumin/Globulin Ratio: 1.4 (ref 1.2–2.2)
Albumin: 4.2 g/dL (ref 3.8–4.9)
Alkaline Phosphatase: 75 IU/L (ref 48–121)
BUN/Creatinine Ratio: 13 (ref 9–20)
BUN: 19 mg/dL (ref 6–24)
Bilirubin Total: <0.2 mg/dL (ref 0.0–1.2)
CO2: 22 mmol/L (ref 20–29)
Calcium: 9.3 mg/dL (ref 8.7–10.2)
Chloride: 100 mmol/L (ref 96–106)
Creatinine, Ser: 1.52 mg/dL — ABNORMAL HIGH (ref 0.76–1.27)
GFR calc Af Amer: 59 mL/min/{1.73_m2} — ABNORMAL LOW (ref >59)
GFR calc non Af Amer: 51 mL/min/{1.73_m2} — ABNORMAL LOW (ref >59)
Globulin, Total: 3 g/dL (ref 1.5–4.5)
Glucose: 173 mg/dL — ABNORMAL HIGH (ref 65–99)
Potassium: 4.6 mmol/L (ref 3.5–5.2)
Sodium: 136 mmol/L (ref 134–144)
Total Protein: 7.2 g/dL (ref 6.0–8.5)

## 2020-02-05 LAB — HEMOGLOBIN A1C
Est. average glucose Bld gHb Est-mCnc: 338 mg/dL
Hgb A1c MFr Bld: 13.4 % — ABNORMAL HIGH (ref 4.8–5.6)

## 2020-02-05 NOTE — Telephone Encounter (Signed)
Called pt and relayed lab results info. He stated understanding.

## 2020-02-05 NOTE — Telephone Encounter (Signed)
Pt was returning a call to get lab results. Please advise.

## 2020-02-18 ENCOUNTER — Telehealth: Payer: Self-pay | Admitting: Registered Nurse

## 2020-02-18 NOTE — Telephone Encounter (Signed)
Pt called and stated that he received the vile out insulin and he is needingthe pen. Please advise.

## 2020-02-19 ENCOUNTER — Other Ambulatory Visit: Payer: Self-pay

## 2020-02-19 NOTE — Telephone Encounter (Signed)
Refills on file with pharmacy for 100 more and then another refill as well this refill is not needed

## 2020-03-09 ENCOUNTER — Other Ambulatory Visit: Payer: Self-pay | Admitting: Family Medicine

## 2020-03-09 DIAGNOSIS — E1165 Type 2 diabetes mellitus with hyperglycemia: Secondary | ICD-10-CM

## 2020-04-06 ENCOUNTER — Other Ambulatory Visit: Payer: Self-pay | Admitting: Family Medicine

## 2020-04-06 DIAGNOSIS — E1165 Type 2 diabetes mellitus with hyperglycemia: Secondary | ICD-10-CM

## 2020-04-06 DIAGNOSIS — E119 Type 2 diabetes mellitus without complications: Secondary | ICD-10-CM | POA: Diagnosis not present

## 2020-04-06 DIAGNOSIS — R071 Chest pain on breathing: Secondary | ICD-10-CM | POA: Diagnosis not present

## 2020-04-06 DIAGNOSIS — I10 Essential (primary) hypertension: Secondary | ICD-10-CM

## 2020-04-06 DIAGNOSIS — E7849 Other hyperlipidemia: Secondary | ICD-10-CM | POA: Diagnosis not present

## 2020-04-13 ENCOUNTER — Other Ambulatory Visit: Payer: Self-pay | Admitting: Family Medicine

## 2020-04-13 DIAGNOSIS — I10 Essential (primary) hypertension: Secondary | ICD-10-CM

## 2020-04-22 ENCOUNTER — Other Ambulatory Visit: Payer: Self-pay | Admitting: Family Medicine

## 2020-04-22 DIAGNOSIS — E1165 Type 2 diabetes mellitus with hyperglycemia: Secondary | ICD-10-CM

## 2020-04-22 DIAGNOSIS — I10 Essential (primary) hypertension: Secondary | ICD-10-CM

## 2020-04-22 NOTE — Telephone Encounter (Signed)
Requested medication (s) are due for refill today: yes  Requested medication (s) are on the active medication list: yes  Last refill:  02/04/20 #30 2 refills   Future visit scheduled: no  Notes to clinic:  overdue labs last drawn 10/27/2018. Do you want to refill?     Requested Prescriptions  Pending Prescriptions Disp Refills   atorvastatin (LIPITOR) 40 MG tablet [Pharmacy Med Name: ATORVASTATIN 40MG  TABLETS] 30 tablet 2    Sig: TAKE 1 TABLET(40 MG) BY MOUTH DAILY      Cardiovascular:  Antilipid - Statins Failed - 04/22/2020 11:14 AM      Failed - Total Cholesterol in normal range and within 360 days    Cholesterol, Total  Date Value Ref Range Status  10/27/2018 211 (H) 100 - 199 mg/dL Final          Failed - LDL in normal range and within 360 days    LDL Calculated  Date Value Ref Range Status  10/27/2018 159 (H) 0 - 99 mg/dL Final          Failed - HDL in normal range and within 360 days    HDL  Date Value Ref Range Status  10/27/2018 39 (L) >39 mg/dL Final          Failed - Triglycerides in normal range and within 360 days    Triglycerides  Date Value Ref Range Status  10/27/2018 67 0 - 149 mg/dL Final          Passed - Patient is not pregnant      Passed - Valid encounter within last 12 months    Recent Outpatient Visits           2 months ago Uncontrolled type 2 diabetes mellitus with hyperglycemia (HCC)   Primary Care at [facility], [facility], MD   11 months ago Uncontrolled type 2 diabetes mellitus with hyperglycemia [facility])   Primary Care at [facility], Shelbie Ammons, NP   11 months ago Shortness of breath   [facility] at [facility], Richard, NP   1 year ago COVID-19 virus infection   Primary Care at [facility], Richard, NP   1 year ago Uncontrolled type 2 diabetes mellitus with hyperglycemia, without long-term current use of insulin (HCC)   Primary Care at [facility], [facility], MD               Signed Prescriptions Disp Refills    lisinopril (ZESTRIL) 40 MG tablet 90 tablet 0    Sig: TAKE 1 TABLET(40 MG) BY MOUTH DAILY      Cardiovascular:  ACE Inhibitors Failed - 04/22/2020 11:14 AM      Failed - Cr in normal range and within 180 days    Creat  Date Value Ref Range Status  07/08/2015 1.15 0.60 - 1.35 mg/dL Final   Creatinine, Ser  Date Value Ref Range Status  02/04/2020 1.52 (H) 0.76 - 1.27 mg/dL Final          Failed - Last BP in normal range    BP Readings from Last 1 Encounters:  02/04/20 (!) 162/94          Passed - K in normal range and within 180 days    Potassium  Date Value Ref Range Status  02/04/2020 4.6 3.5 - 5.2 mmol/L Final          Passed - Patient is not pregnant      Passed - Valid encounter within last 6 months  Recent Outpatient Visits           2 months ago Uncontrolled type 2 diabetes mellitus with hyperglycemia [facility])   Primary Care at [facility], Sandria Bales, MD   11 months ago Uncontrolled type 2 diabetes mellitus with hyperglycemia [facility])   [facility] at [facility], Gerlene Burdock, NP   11 months ago Shortness of breath   [facility] at [facility], Gerlene Burdock, NP   1 year ago COVID-19 virus infection   Primary Care at [facility], Richard, NP   1 year ago Uncontrolled type 2 diabetes mellitus with hyperglycemia, without long-term current use of insulin [facility])   Primary Care at [facility], Manus Rudd, MD

## 2020-07-07 ENCOUNTER — Other Ambulatory Visit: Payer: Self-pay | Admitting: Family Medicine

## 2020-07-07 DIAGNOSIS — I10 Essential (primary) hypertension: Secondary | ICD-10-CM

## 2020-07-13 ENCOUNTER — Other Ambulatory Visit: Payer: Self-pay | Admitting: Internal Medicine

## 2020-07-14 LAB — COMPLETE METABOLIC PANEL WITH GFR
AG Ratio: 1.6 (calc) (ref 1.0–2.5)
ALT: 16 U/L (ref 9–46)
AST: 13 U/L (ref 10–35)
Albumin: 4.4 g/dL (ref 3.6–5.1)
Alkaline phosphatase (APISO): 54 U/L (ref 35–144)
BUN: 12 mg/dL (ref 7–25)
CO2: 22 mmol/L (ref 20–32)
Calcium: 9.8 mg/dL (ref 8.6–10.3)
Chloride: 97 mmol/L — ABNORMAL LOW (ref 98–110)
Creat: 0.88 mg/dL (ref 0.70–1.33)
GFR, Est African American: 113 mL/min/{1.73_m2} (ref >=60)
GFR, Est Non African American: 97 mL/min/{1.73_m2} (ref >=60)
Globulin: 2.8 g/dL (calc) (ref 1.9–3.7)
Glucose, Bld: 219 mg/dL — ABNORMAL HIGH (ref 65–99)
Potassium: 4.2 mmol/L (ref 3.5–5.3)
Sodium: 132 mmol/L — ABNORMAL LOW (ref 135–146)
Total Bilirubin: 0.5 mg/dL (ref 0.2–1.2)
Total Protein: 7.2 g/dL (ref 6.1–8.1)

## 2020-07-14 LAB — CBC
HCT: 40.9 % (ref 38.5–50.0)
Hemoglobin: 14 g/dL (ref 13.2–17.1)
MCH: 28.9 pg (ref 27.0–33.0)
MCHC: 34.2 g/dL (ref 32.0–36.0)
MCV: 84.5 fL (ref 80.0–100.0)
MPV: 13 fL — ABNORMAL HIGH (ref 7.5–12.5)
Platelets: 184 10*3/uL (ref 140–400)
RBC: 4.84 10*6/uL (ref 4.20–5.80)
RDW: 13 % (ref 11.0–15.0)
WBC: 4.4 10*3/uL (ref 3.8–10.8)

## 2020-07-14 LAB — LIPID PANEL
Cholesterol: 270 mg/dL — ABNORMAL HIGH (ref <200)
HDL: 50 mg/dL (ref >=40)
LDL Cholesterol (Calc): 169 mg/dL (calc) — ABNORMAL HIGH
Non-HDL Cholesterol (Calc): 220 mg/dL (calc) — ABNORMAL HIGH (ref <130)
Total CHOL/HDL Ratio: 5.4 (calc) — ABNORMAL HIGH (ref <5.0)
Triglycerides: 300 mg/dL — ABNORMAL HIGH (ref <150)

## 2020-07-14 LAB — PSA: PSA: 0.61 ng/mL (ref <=4.0)

## 2020-07-14 LAB — TSH: TSH: 0.9 mIU/L (ref 0.40–4.50)

## 2020-07-14 LAB — VITAMIN D 25 HYDROXY (VIT D DEFICIENCY, FRACTURES): Vit D, 25-Hydroxy: 25 ng/mL — ABNORMAL LOW (ref 30–100)

## 2020-08-18 ENCOUNTER — Other Ambulatory Visit: Payer: Self-pay | Admitting: Family Medicine

## 2020-08-18 DIAGNOSIS — I10 Essential (primary) hypertension: Secondary | ICD-10-CM

## 2020-08-18 DIAGNOSIS — E1165 Type 2 diabetes mellitus with hyperglycemia: Secondary | ICD-10-CM

## 2020-08-23 ENCOUNTER — Telehealth: Payer: Self-pay | Admitting: Registered Nurse

## 2020-08-23 NOTE — Telephone Encounter (Signed)
Scheduled patient for med review appt on 3/16 with provider.

## 2020-08-23 NOTE — Telephone Encounter (Signed)
Patient called his pharmacy and could not remember his current meds and is needing refills on someof them  patient would like a call letting him know what recent Meds are . He seems a little confused   Please call patient at [phone]

## 2020-08-23 NOTE — Telephone Encounter (Signed)
All eligible RX have been filled no other RX available for refill. Pt should have a medication review appt if having trouble with keeping meds correct.    Appt for medication review no recent appt.

## 2020-08-24 ENCOUNTER — Other Ambulatory Visit: Payer: Self-pay

## 2020-08-24 ENCOUNTER — Ambulatory Visit (INDEPENDENT_AMBULATORY_CARE_PROVIDER_SITE_OTHER): Payer: BLUE CROSS/BLUE SHIELD | Admitting: Registered Nurse

## 2020-08-24 ENCOUNTER — Ambulatory Visit: Payer: BC Managed Care – PPO | Admitting: Registered Nurse

## 2020-08-24 VITALS — Wt 213.6 lb

## 2020-08-24 DIAGNOSIS — I10 Essential (primary) hypertension: Secondary | ICD-10-CM

## 2020-08-24 DIAGNOSIS — E1165 Type 2 diabetes mellitus with hyperglycemia: Secondary | ICD-10-CM | POA: Diagnosis not present

## 2020-08-24 LAB — POCT GLYCOSYLATED HEMOGLOBIN (HGB A1C): Hemoglobin A1C: 10.8 % — AB (ref 4.0–5.6)

## 2020-08-24 MED ORDER — ATORVASTATIN CALCIUM 40 MG PO TABS: 40.0000 mg | ORAL_TABLET | Freq: Every day | ORAL | 0 refills | Status: DC

## 2020-08-24 MED ORDER — LISINOPRIL 40 MG PO TABS: 40.0000 mg | ORAL_TABLET | Freq: Every day | ORAL | 0 refills | Status: DC

## 2020-08-24 MED ORDER — "INSULIN SYRINGE-NEEDLE U-100 31G X 5/16"" 0.3 ML MISC": 3 refills | Status: DC

## 2020-08-24 MED ORDER — CANAGLIFLOZIN 300 MG PO TABS: 300.0000 mg | ORAL_TABLET | Freq: Every day | ORAL | 5 refills | Status: DC

## 2020-08-24 MED ORDER — METFORMIN HCL 1000 MG PO TABS: 1000.0000 mg | ORAL_TABLET | Freq: Two times a day (BID) | ORAL | 0 refills | Status: DC

## 2020-08-24 MED ORDER — INSULIN GLARGINE 100 UNIT/ML ~~LOC~~ SOLN: 20.0000 [IU] | Freq: Every day | SUBCUTANEOUS | 3 refills | Status: DC

## 2020-08-24 MED ORDER — BD PEN NEEDLE NANO U/F 32G X 4 MM MISC: 2 refills | Status: DC

## 2020-08-24 MED ORDER — LANTUS SOLOSTAR 100 UNIT/ML ~~LOC~~ SOPN: PEN_INJECTOR | SUBCUTANEOUS | 0 refills | Status: DC

## 2020-08-24 NOTE — Patient Instructions (Signed)
° ° ° °  If you have lab work done today you will be contacted with your lab results within the next 2 weeks.  If you have not heard from us then please contact us. The fastest way to get your results is to register for My Chart. ° ° °IF you received an x-ray today, you will receive an invoice from [facility]. Please contact [facility] at [phone] with questions or concerns regarding your invoice.  ° °IF you received labwork today, you will receive an invoice from [facility]. Please contact [facility] at [phone] with questions or concerns regarding your invoice.  ° °Our billing staff will not be able to assist you with questions regarding bills from these companies. ° °You will be contacted with the lab results as soon as they are available. The fastest way to get your results is to activate your My Chart account. Instructions are located on the last page of this paperwork. If you have not heard from us regarding the results in 2 weeks, please contact this office. °  ° ° ° °

## 2021-01-21 ENCOUNTER — Emergency Department (HOSPITAL_BASED_OUTPATIENT_CLINIC_OR_DEPARTMENT_OTHER)
Admission: EM | Admit: 2021-01-21 | Discharge: 2021-01-21 | Disposition: A | Discharge status: Home / Self Care | Payer: 59 | Attending: Emergency Medicine | Admitting: Emergency Medicine

## 2021-01-21 ENCOUNTER — Emergency Department (HOSPITAL_BASED_OUTPATIENT_CLINIC_OR_DEPARTMENT_OTHER): Payer: 59 | Admitting: Radiology

## 2021-01-21 ENCOUNTER — Encounter (HOSPITAL_BASED_OUTPATIENT_CLINIC_OR_DEPARTMENT_OTHER): Payer: Self-pay

## 2021-01-21 ENCOUNTER — Other Ambulatory Visit: Payer: Self-pay

## 2021-01-21 DIAGNOSIS — Z794 Long term (current) use of insulin: Secondary | ICD-10-CM | POA: Diagnosis not present

## 2021-01-21 DIAGNOSIS — Z7984 Long term (current) use of oral hypoglycemic drugs: Secondary | ICD-10-CM | POA: Diagnosis not present

## 2021-01-21 DIAGNOSIS — Z79899 Other long term (current) drug therapy: Secondary | ICD-10-CM | POA: Insufficient documentation

## 2021-01-21 DIAGNOSIS — R0789 Other chest pain: Secondary | ICD-10-CM | POA: Insufficient documentation

## 2021-01-21 DIAGNOSIS — Z7982 Long term (current) use of aspirin: Secondary | ICD-10-CM | POA: Diagnosis not present

## 2021-01-21 DIAGNOSIS — E119 Type 2 diabetes mellitus without complications: Secondary | ICD-10-CM | POA: Diagnosis not present

## 2021-01-21 DIAGNOSIS — N182 Chronic kidney disease, stage 2 (mild): Secondary | ICD-10-CM | POA: Diagnosis not present

## 2021-01-21 DIAGNOSIS — R079 Chest pain, unspecified: Secondary | ICD-10-CM

## 2021-01-21 DIAGNOSIS — I129 Hypertensive chronic kidney disease with stage 1 through stage 4 chronic kidney disease, or unspecified chronic kidney disease: Secondary | ICD-10-CM | POA: Diagnosis not present

## 2021-01-21 DIAGNOSIS — E1122 Type 2 diabetes mellitus with diabetic chronic kidney disease: Secondary | ICD-10-CM | POA: Diagnosis not present

## 2021-01-21 LAB — CBC
HCT: 42.6 % (ref 39.0–52.0)
Hemoglobin: 14.6 g/dL (ref 13.0–17.0)
MCH: 28.6 pg (ref 26.0–34.0)
MCHC: 34.3 g/dL (ref 30.0–36.0)
MCV: 83.5 fL (ref 80.0–100.0)
Platelets: 185 10*3/uL (ref 150–400)
RBC: 5.1 MIL/uL (ref 4.22–5.81)
RDW: 13 % (ref 11.5–15.5)
WBC: 5.8 10*3/uL (ref 4.0–10.5)
nRBC: 0 % (ref 0.0–0.2)

## 2021-01-21 LAB — BASIC METABOLIC PANEL
Anion gap: 11 (ref 5–15)
BUN: 14 mg/dL (ref 6–20)
CO2: 26 mmol/L (ref 22–32)
Calcium: 9.7 mg/dL (ref 8.9–10.3)
Chloride: 98 mmol/L (ref 98–111)
Creatinine, Ser: 0.98 mg/dL (ref 0.61–1.24)
GFR, Estimated: >60 mL/min (ref >60)
Glucose, Bld: 149 mg/dL — ABNORMAL HIGH (ref 70–99)
Potassium: 3.8 mmol/L (ref 3.5–5.1)
Sodium: 135 mmol/L (ref 135–145)

## 2021-01-21 LAB — TROPONIN I (HIGH SENSITIVITY): Troponin I (High Sensitivity): 4 ng/L (ref <18)

## 2021-01-21 NOTE — ED Triage Notes (Signed)
Pt states he has had L sided CP for over a year now. Evaluated by PCP and cleared. States pain only present when he presses on the area. Also states he has back pain "for more than three years now".

## 2021-01-21 NOTE — Discharge Instructions (Addendum)
Call your primary care doctor or specialist as discussed in the next 2-3 days.   Return immediately back to the ER if:  Your symptoms worsen within the next 12-24 hours. You develop new symptoms such as new fevers, persistent vomiting, new pain, shortness of breath, or new weakness or numbness, or if you have any other concerns.  

## 2021-01-21 NOTE — ED Notes (Signed)
Pt reports that pain is intermittent but he is pain free at the moment

## 2021-01-21 NOTE — ED Provider Notes (Signed)
[facility] EMERGENCY DEPT Provider Note   CSN: [id number] Arrival date & time: 01/21/21  1449     History Chief Complaint  Patient presents with   Chest Pain    Jackson Mahoney is a 55 y.o. male.  Patient presenting with left lateral chest pain ongoing for a year intermittently.  He states it comes and goes at random and lasts 1 second at a time.  The last 3 days he has had multiple episodes of 1 second chest pain on the left lateral side.  He states it does not really bother him but has had some worried.  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  Describes pain in the left lateral chest underneath his nipple does not radiate elsewhere.  Denies shortness of breath diaphoresis fevers nausea or vomiting.      Past Medical History:  Diagnosis Date   Allergy    Diabetes mellitus without complication ([place])    Hyperlipidemia    Hypertension     Patient Active Problem List   Diagnosis Date Noted   Dyslipidemia 11/22/2017   CRI (chronic renal insufficiency), stage 2 (mild) 08/08/2016   Microalbuminuria due to type 2 diabetes mellitus ([place]) 08/08/2016   Uncontrolled type 2 diabetes mellitus ([place]) 08/08/2016   Diabetes mellitus without complication ([place]) 40/98/1191   Essential hypertension 07/25/2012   Mixed hyperlipidemia 07/25/2012    Past Surgical History:  Procedure Laterality Date   NO PAST SURGERIES         Family History  Problem Relation Age of Onset   Diabetes Father    Colon cancer Neg Hx    Colon polyps Neg Hx    Rectal cancer Neg Hx    Stomach cancer Neg Hx    Esophageal cancer Neg Hx     Social History   Tobacco Use   Smoking status: Never   Smokeless tobacco: Never  Substance Use Topics   Alcohol use: Yes    Alcohol/week: 2.0 standard drinks    Types: 2 Cans of beer per week   Drug use: No    Home Medications Prior to  Admission medications   Medication Sig Start Date End Date Taking? Authorizing Provider  ACCU-CHEK FASTCLIX LANCETS MISC USE UTD Patient not taking: Reported on 08/24/2020 11/22/17   [provider]  albuterol (VENTOLIN HFA) 108 (90 Base) MCG/ACT inhaler Inhale 2 puffs into the lungs every 6 (six) hours as needed for wheezing or shortness of breath. 04/22/19   Maximiano Coss, NP  aspirin 81 MG EC tablet Take 81 mg by mouth daily. Swallow whole.    [provider]  atorvastatin (LIPITOR) 40 MG tablet Take 1 tablet (40 mg total) by mouth daily. 08/24/20   Maximiano Coss, NP  canagliflozin (INVOKANA) 300 MG TABS tablet Take 1 tablet (300 mg total) by mouth daily. 08/24/20   Maximiano Coss, NP  famciclovir (FAMVIR) 500 MG tablet TK 1 T PO BID FOR 5 DAYS 02/07/18   [provider]  glucose blood test strip Use as instructed  Diagnosis Code E11.9 05/25/19   Maximiano Coss, NP  glucose monitoring kit (FREESTYLE) monitoring kit 1 each by Does not apply route as needed for other. 07/25/12 02/24/18  Posey Boyer, MD  insulin glargine (LANTUS SOLOSTAR) 100 UNIT/ML Solostar Pen INJECT 20 UNITS INTO THE SKIN DAILY AT 10 PM 08/24/20   Maximiano Coss, NP  insulin glargine (LANTUS)  100 UNIT/ML injection Inject 0.2 mLs (20 Units total) into the skin at bedtime. 08/24/20 08/24/21  Maximiano Coss, NP  Insulin Pen Needle (BD PEN NEEDLE NANO U/F) 32G X 4 MM MISC USE WITH INSULIN EVERY EVENING 08/24/20   Maximiano Coss, NP  Insulin Syringe-Needle U-100 (B-D INSULIN SYRINGE) 31G X 5/16" 0.3 ML MISC Use to administer insulin daily at bedtime 08/24/20   Maximiano Coss, NP  lisinopril (ZESTRIL) 40 MG tablet Take 1 tablet (40 mg total) by mouth daily. 08/24/20   Maximiano Coss, NP  metFORMIN (GLUCOPHAGE) 1000 MG tablet Take 1 tablet (1,000 mg total) by mouth 2 (two) times daily with a meal. 08/24/20   Maximiano Coss, NP  zolpidem (AMBIEN) 10 MG tablet TK 1 T PO HS PRN 01/24/18   [provider]    Allergies    Wasp venom and Chloroquine  Review of Systems   Review of Systems  Constitutional:  Negative for fever.  HENT:  Negative for ear pain and sore throat.   Eyes:  Negative for pain.  Respiratory:  Negative for cough.   Cardiovascular:  Positive for chest pain.  Gastrointestinal:  Negative for abdominal pain.  Genitourinary:  Negative for flank pain.  Musculoskeletal:  Negative for back pain.  Skin:  Negative for color change and rash.  Neurological:  Negative for syncope.  All other systems reviewed and are negative.  Physical Exam Updated Vital Signs BP 137/82   Pulse 64   Temp 98.2 F (36.8 C) (Oral)   Resp 19   Ht $R'5\' 11"'xd$  (1.803 m)   Wt 79.4 kg   SpO2 98%   BMI 24.41 kg/m   Physical Exam Constitutional:      Appearance: He is well-developed.  HENT:     Head: Normocephalic.     Nose: Nose normal.  Eyes:     Extraocular Movements: Extraocular movements intact.  Cardiovascular:     Rate and Rhythm: Normal rate.  Pulmonary:     Effort: Pulmonary effort is normal.  Skin:    Coloration: Skin is not jaundiced.  Neurological:     Mental Status: He is alert. Mental status is at baseline.    ED Results / Procedures / Treatments   Labs (all labs ordered are listed, but only abnormal results are displayed) Labs Reviewed  BASIC METABOLIC PANEL - Abnormal; Notable for the following components:      Result Value   Glucose, Bld 149 (*)    All other components within normal limits  CBC  TROPONIN I (HIGH SENSITIVITY)    EKG EKG Interpretation  Date/Time:  Saturday January 21 2021 15:08:14 EDT Ventricular Rate:  76 PR Interval:  144 QRS Duration: 82 QT Interval:  356 QTC Calculation: 400 R Axis:   73 Text Interpretation: Normal sinus rhythm T wave abnormality, consider lateral ischemia Abnormal ECG Confirmed by Thamas Jaegers ([id number]) on 01/21/2021 3:17:35 PM  Radiology DG Chest Port 1 View  Result Date: 01/21/2021 CLINICAL DATA:  Chest pain for  1 year EXAM: PORTABLE CHEST 1 VIEW COMPARISON:  None. FINDINGS: The heart size and mediastinal contours are within normal limits. Both lungs are clear. The visualized skeletal structures are unremarkable. IMPRESSION: No active disease. Electronically Signed   By: Zerita Boers M.D.   On: 01/21/2021 16:05    Procedures Procedures   Medications Ordered in ED Medications - No data to display  ED Course  I have reviewed the triage vital signs and the nursing notes.  Pertinent labs &  imaging results that were available during my care of the patient were reviewed by me and considered in my medical decision making (see chart for details).    MDM Rules/Calculators/A&P                           EKG shows sinus rhythm.  T wave inversion seen in V4 and V5.  No ST elevations noted, rate is normal,  Troponins otherwise negative.  Given atypical presentation of chest pain, chronicity of pain and the 1 to 2 seconds of pain I doubt acute coronary syndrome.  Imaging is otherwise unremarkable as well.  Recommending outpatient follow-up with cardiology within the week.  Recommending immediate return for worsening pain or any additional concerns.  Final Clinical Impression(s) / ED Diagnoses Final diagnoses:  Chest pain    Rx / DC Orders ED Discharge Orders     None        Luna Fuse, MD 01/21/21 [phone]

## 2021-02-08 ENCOUNTER — Other Ambulatory Visit: Payer: Self-pay | Admitting: Registered Nurse

## 2021-02-08 DIAGNOSIS — I10 Essential (primary) hypertension: Secondary | ICD-10-CM

## 2021-03-10 ENCOUNTER — Other Ambulatory Visit: Payer: Self-pay | Admitting: Registered Nurse

## 2021-03-10 DIAGNOSIS — E1165 Type 2 diabetes mellitus with hyperglycemia: Secondary | ICD-10-CM

## 2021-04-18 NOTE — Progress Notes (Signed)
Established Patient Office Visit  Subjective:  Patient ID: Jackson Mahoney, male    DOB: [birth year]  Age: 55 y.o. MRN: [id number]  CC:  Chief Complaint  Patient presents with   Medication Refill    Patient states he is here for medication refill. And medication review but no other concerns.    HPI Jackson Mahoney presents for t2dm  Last A1c:  Lab Results  Component Value Date   HGBA1C 10.8 (A) 08/24/2020    Currently taking: glargine 20 units nightly, metformin 1062m po bid ac,invokana 3068mpo qd No new complications Reports good compliance with medications Diet has been poor Exercise habits have been limited  Hypertension: Patient Currently taking: no medications  Uncontrolled. Denies CV symptoms including: chest pain, shob, doe, headache, visual changes, fatigue, claudication, and dependent edema.   Previous readings and labs: BP Readings from Last 3 Encounters:  01/21/21 140/82  02/04/20 (!) 162/94  08/24/19 (!) 172/95   Lab Results  Component Value Date   CREATININE 0.98 01/21/2021     Past Medical History:  Diagnosis Date   Allergy    Diabetes mellitus without complication ([place]   Hyperlipidemia    Hypertension     Past Surgical History:  Procedure Laterality Date   NO PAST SURGERIES      Family History  Problem Relation Age of Onset   Diabetes Father    Colon cancer Neg Hx    Colon polyps Neg Hx    Rectal cancer Neg Hx    Stomach cancer Neg Hx    Esophageal cancer Neg Hx     Social History   Socioeconomic History   Marital status: Single    Spouse name: Not on file   Number of children: Not on file   Years of education: Not on file   Highest education level: Not on file  Occupational History   Not on file  Tobacco Use   Smoking status: Never   Smokeless tobacco: Never  Substance and Sexual Activity   Alcohol use: Yes    Alcohol/week: 2.0 standard drinks    Types: 2 Cans of beer per week   Drug use: No   Sexual activity: Never   Other Topics Concern   Not on file  Social History Narrative   Not on file   Social Determinants of Health   Financial Resource Strain: Not on file  Food Insecurity: Not on file  Transportation Needs: Not on file  Physical Activity: Not on file  Stress: Not on file  Social Connections: Not on file  Intimate Partner Violence: Not on file    Outpatient Medications Prior to Visit  Medication Sig Dispense Refill   albuterol (VENTOLIN HFA) 108 (90 Base) MCG/ACT inhaler Inhale 2 puffs into the lungs every 6 (six) hours as needed for wheezing or shortness of breath. 18 g 1   aspirin 81 MG EC tablet Take 81 mg by mouth daily. Swallow whole.     famciclovir (FAMVIR) 500 MG tablet TK 1 T PO BID FOR 5 DAYS  5   glucose blood test strip Use as instructed  Diagnosis Code E11.9 100 each 12   zolpidem (AMBIEN) 10 MG tablet TK 1 T PO HS PRN  2   atorvastatin (LIPITOR) 40 MG tablet TAKE 1 TABLET(40 MG) BY MOUTH DAILY 30 tablet 2   canagliflozin (INVOKANA) 300 MG TABS tablet Take 1 tablet (300 mg total) by mouth daily. 30 tablet 5   insulin glargine (LANTUS) 100 UNIT/ML injection  Inject 0.2 mLs (20 Units total) into the skin at bedtime. 10 mL 3   Insulin Pen Needle (BD PEN NEEDLE NANO U/F) 32G X 4 MM MISC USE WITH INSULIN EVERY EVENING 100 each 2   Insulin Syringe-Needle U-100 (B-D INSULIN SYRINGE) 31G X 5/16" 0.3 ML MISC Use to administer insulin daily at bedtime 60 each 3   LANTUS SOLOSTAR 100 UNIT/ML Solostar Pen INJECT 20 UNITS INTO THE SKIN DAILY AT 10 PM 30 mL 0   lisinopril (ZESTRIL) 40 MG tablet TAKE 1 TABLET(40 MG) BY MOUTH DAILY 90 tablet 0   metFORMIN (GLUCOPHAGE) 500 MG tablet TAKE 1 TABLET(500 MG) BY MOUTH THREE TIMES DAILY 90 tablet 2   ACCU-CHEK FASTCLIX LANCETS MISC USE UTD (Patient not taking: Reported on 08/24/2020)  0   glucose monitoring kit (FREESTYLE) monitoring kit 1 each by Does not apply route as needed for other. 1 each 0   amoxicillin (AMOXIL) 875 MG tablet Take 1 tablet  (875 mg total) by mouth 2 (two) times daily. (Patient not taking: Reported on 08/24/2020) 20 tablet 0   cephALEXin (KEFLEX) 500 MG capsule Take 1 capsule (500 mg total) by mouth 4 (four) times daily. (Patient not taking: Reported on 08/24/2020) 20 capsule 0   HYDROcodone-acetaminophen (NORCO) 5-325 MG tablet One every 6 hours if needed for pain (Patient not taking: Reported on 08/24/2020) 20 tablet 0   Facility-Administered Medications Prior to Visit  Medication Dose Route Frequency Provider Last Rate Last Admin   0.9 %  sodium chloride infusion  500 mL Intravenous Once Cirigliano, Vito V, DO        Allergies  Allergen Reactions   Wasp Venom Anaphylaxis   Chloroquine Itching    ROS Review of Systems  Constitutional: Negative.   HENT: Negative.    Eyes: Negative.   Respiratory: Negative.    Cardiovascular: Negative.   Gastrointestinal: Negative.   Genitourinary: Negative.   Musculoskeletal: Negative.   Skin: Negative.   Neurological: Negative.   Psychiatric/Behavioral: Negative.    All other systems reviewed and are negative.    Objective:    Physical Exam Constitutional:      General: He is not in acute distress.    Appearance: Normal appearance. He is normal weight. He is not ill-appearing, toxic-appearing or diaphoretic.  Cardiovascular:     Rate and Rhythm: Normal rate and regular rhythm.     Heart sounds: Normal heart sounds. No murmur heard.   No friction rub. No gallop.  Pulmonary:     Effort: Pulmonary effort is normal. No respiratory distress.     Breath sounds: Normal breath sounds. No stridor. No wheezing, rhonchi or rales.  Chest:     Chest wall: No tenderness.  Neurological:     General: No focal deficit present.     Mental Status: He is alert and oriented to person, place, and time. Mental status is at baseline.  Psychiatric:        Mood and Affect: Mood normal.        Behavior: Behavior normal.        Thought Content: Thought content normal.         Judgment: Judgment normal.    Wt 213 lb 9.6 oz (96.9 kg)   BMI 29.79 kg/m  Wt Readings from Last 3 Encounters:  01/21/21 175 lb (79.4 kg)  08/24/20 213 lb 9.6 oz (96.9 kg)  02/04/20 208 lb (94.3 kg)     Health Maintenance Due  Topic Date Due   COVID-19  Vaccine (1) Never done   Zoster Vaccines- Shingrix (1 of 2) Never done   Pneumococcal Vaccine 86-23 Years old (2 - PCV) 08/29/2017   OPHTHALMOLOGY EXAM  12/26/2018   INFLUENZA VACCINE  01/09/2021   HEMOGLOBIN A1C  02/24/2021    There are no preventive care reminders to display for this patient.  Lab Results  Component Value Date   TSH 0.90 07/13/2020   Lab Results  Component Value Date   WBC 5.8 01/21/2021   HGB 14.6 01/21/2021   HCT 42.6 01/21/2021   MCV 83.5 01/21/2021   PLT 185 01/21/2021   Lab Results  Component Value Date   NA 135 01/21/2021   K 3.8 01/21/2021   CO2 26 01/21/2021   GLUCOSE 149 (H) 01/21/2021   BUN 14 01/21/2021   CREATININE 0.98 01/21/2021   BILITOT 0.5 07/13/2020   ALKPHOS 75 02/04/2020   AST 13 07/13/2020   ALT 16 07/13/2020   PROT 7.2 07/13/2020   ALBUMIN 4.2 02/04/2020   CALCIUM 9.7 01/21/2021   ANIONGAP 11 01/21/2021   Lab Results  Component Value Date   CHOL 270 (H) 07/13/2020   Lab Results  Component Value Date   HDL 50 07/13/2020   Lab Results  Component Value Date   LDLCALC 169 (H) 07/13/2020   Lab Results  Component Value Date   TRIG 300 (H) 07/13/2020   Lab Results  Component Value Date   CHOLHDL 5.4 (H) 07/13/2020   Lab Results  Component Value Date   HGBA1C 10.8 (A) 08/24/2020      Assessment & Plan:   Problem List Items Addressed This Visit       Cardiovascular and Mediastinum   Essential hypertension     Endocrine   Uncontrolled type 2 diabetes mellitus - Primary   Relevant Medications   Insulin Pen Needle (BD PEN NEEDLE NANO U/F) 32G X 4 MM MISC   metFORMIN (GLUCOPHAGE) 1000 MG tablet   insulin glargine (LANTUS) 100 UNIT/ML injection    canagliflozin (INVOKANA) 300 MG TABS tablet   insulin glargine (LANTUS SOLOSTAR) 100 UNIT/ML Solostar Pen   Insulin Syringe-Needle U-100 (B-D INSULIN SYRINGE) 31G X 5/16" 0.3 ML MISC   Other Relevant Orders   POCT glycosylated hemoglobin (Hb A1C) (Completed)    Meds ordered this encounter  Medications   DISCONTD: atorvastatin (LIPITOR) 40 MG tablet    Sig: Take 1 tablet (40 mg total) by mouth daily.    Dispense:  90 tablet    Refill:  0   Insulin Pen Needle (BD PEN NEEDLE NANO U/F) 32G X 4 MM MISC    Sig: USE WITH INSULIN EVERY EVENING    Dispense:  100 each    Refill:  2   metFORMIN (GLUCOPHAGE) 1000 MG tablet    Sig: Take 1 tablet (1,000 mg total) by mouth 2 (two) times daily with a meal.    Dispense:  180 tablet    Refill:  0   insulin glargine (LANTUS) 100 UNIT/ML injection    Sig: Inject 0.2 mLs (20 Units total) into the skin at bedtime.    Dispense:  10 mL    Refill:  3    Order Specific Question:   Supervising Provider    Answer:   Carlota Raspberry, JEFFREY R [[id number]]   canagliflozin (INVOKANA) 300 MG TABS tablet    Sig: Take 1 tablet (300 mg total) by mouth daily.    Dispense:  30 tablet    Refill:  5    Order  Specific Question:   Supervising Provider    Answer:   Carlota Raspberry, JEFFREY R [[id number]]   insulin glargine (LANTUS SOLOSTAR) 100 UNIT/ML Solostar Pen    Sig: INJECT 20 UNITS INTO THE SKIN DAILY AT 10 PM    Dispense:  30 mL    Refill:  0    Order Specific Question:   Supervising Provider    Answer:   Carlota Raspberry, JEFFREY R [[id number]]   Insulin Syringe-Needle U-100 (B-D INSULIN SYRINGE) 31G X 5/16" 0.3 ML MISC    Sig: Use to administer insulin daily at bedtime    Dispense:  60 each    Refill:  3    Order Specific Question:   Supervising Provider    Answer:   Carlota Raspberry, JEFFREY R [[id number]]   DISCONTD: lisinopril (ZESTRIL) 40 MG tablet    Sig: Take 1 tablet (40 mg total) by mouth daily.    Dispense:  90 tablet    Refill:  0    Order Specific Question:   Supervising Provider    Answer:    Carlota Raspberry, JEFFREY R [[id number]]    Follow-up: No follow-ups on file.   PLAN Restart medications as above. Return in 3 mo. Uncontrolled t2dm and htn, discussed risks of leaving these uncontrolled.  Discussed lifestyle interventions for t2dm and htn Patient encouraged to call clinic with any questions, comments, or concerns.  Maximiano Coss, NP

## 2021-05-09 ENCOUNTER — Other Ambulatory Visit: Payer: Self-pay | Admitting: Registered Nurse

## 2021-05-09 DIAGNOSIS — I10 Essential (primary) hypertension: Secondary | ICD-10-CM

## 2021-06-08 ENCOUNTER — Other Ambulatory Visit: Payer: Self-pay | Admitting: Registered Nurse

## 2021-06-08 DIAGNOSIS — I10 Essential (primary) hypertension: Secondary | ICD-10-CM

## 2021-07-16 ENCOUNTER — Other Ambulatory Visit: Payer: Self-pay | Admitting: Registered Nurse

## 2021-07-16 DIAGNOSIS — E1165 Type 2 diabetes mellitus with hyperglycemia: Secondary | ICD-10-CM

## 2021-09-24 ENCOUNTER — Other Ambulatory Visit: Payer: Self-pay | Admitting: Registered Nurse

## 2021-09-24 DIAGNOSIS — E1165 Type 2 diabetes mellitus with hyperglycemia: Secondary | ICD-10-CM

## 2021-09-25 ENCOUNTER — Other Ambulatory Visit: Payer: Self-pay | Admitting: Registered Nurse

## 2021-09-25 DIAGNOSIS — E1165 Type 2 diabetes mellitus with hyperglycemia: Secondary | ICD-10-CM

## 2021-09-25 NOTE — Telephone Encounter (Signed)
Courtesy refill patient needs an appointment for further refills  

## 2022-02-25 IMAGING — DX DG CHEST 1V PORT
1 series · 1 of 1 positions shown · non-contrast
Comparison: None.

CLINICAL DATA: Chest pain for 1 year

EXAM:
PORTABLE CHEST 1 VIEW

[chest]
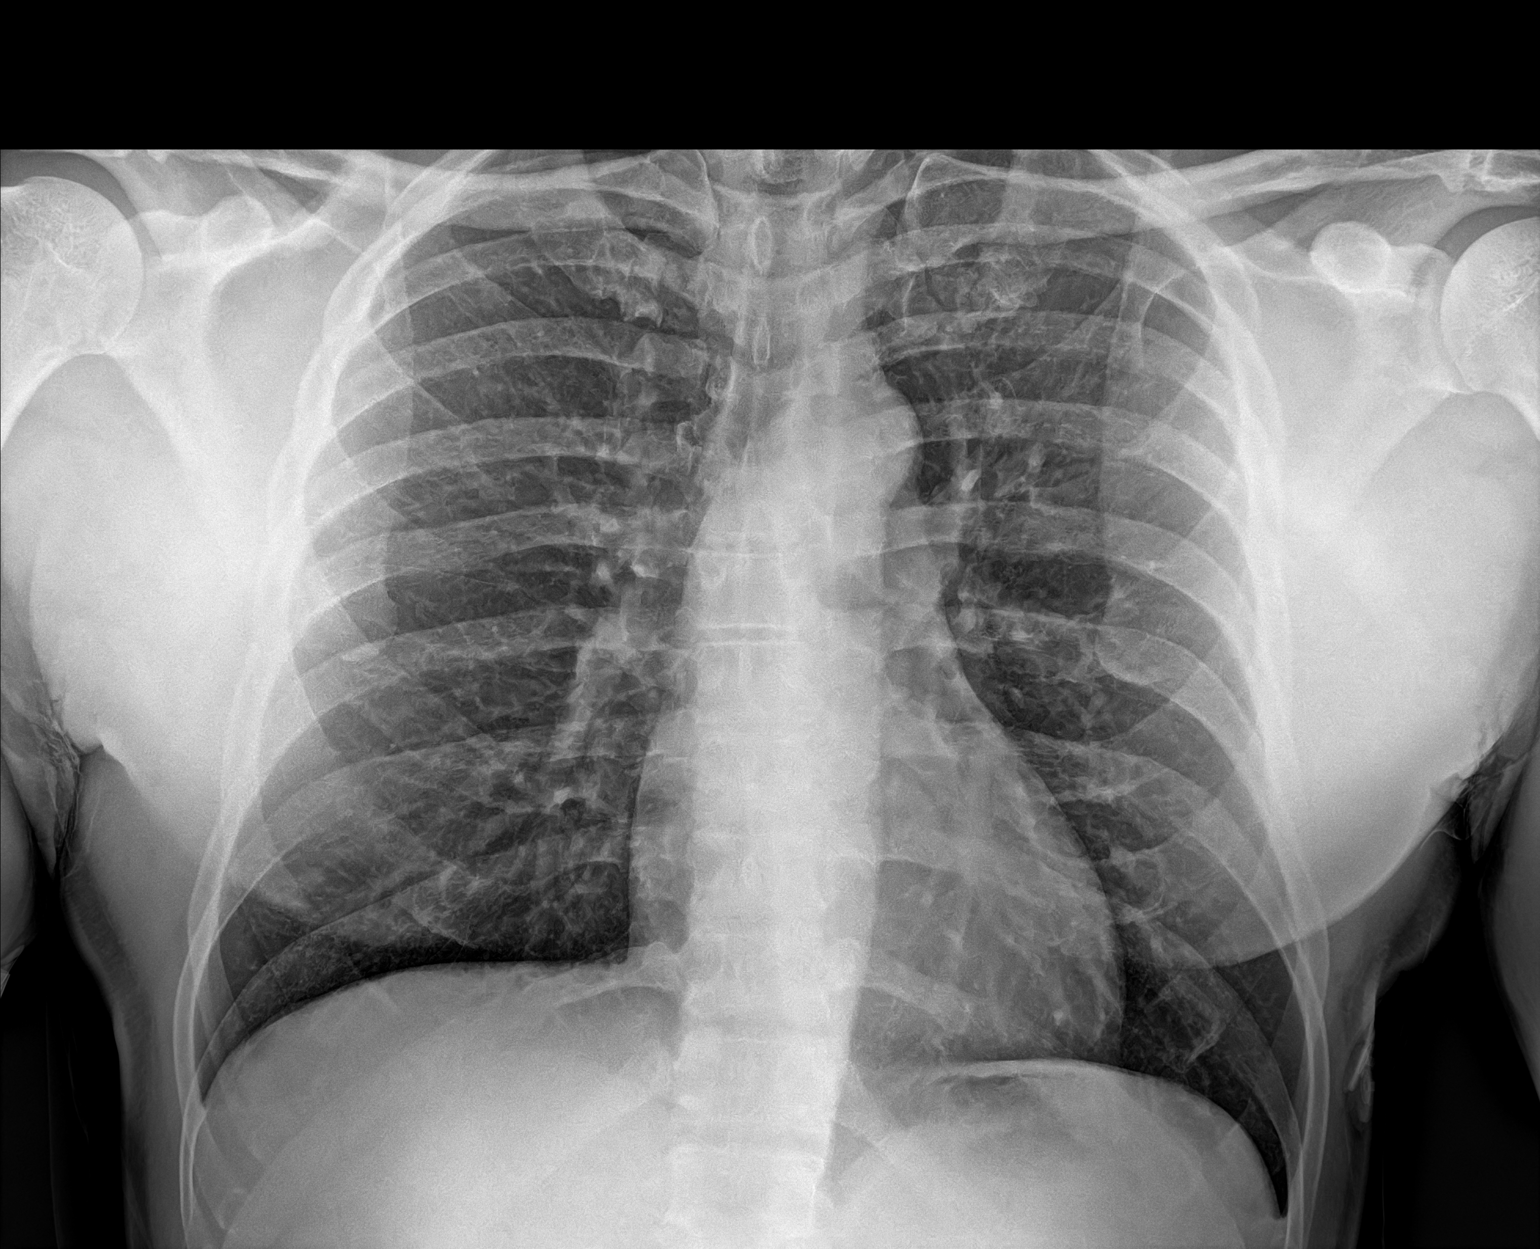

[1 of 1 positions shown; findings below may reference images not displayed]

FINDINGS: The heart size and mediastinal contours are within normal limits.
Both lungs are clear. The visualized skeletal structures are
unremarkable.
IMPRESSION: No active disease.

## 2022-11-16 ENCOUNTER — Encounter (HOSPITAL_BASED_OUTPATIENT_CLINIC_OR_DEPARTMENT_OTHER): Payer: Self-pay | Admitting: Emergency Medicine

## 2022-11-16 ENCOUNTER — Other Ambulatory Visit: Payer: Self-pay

## 2022-11-16 ENCOUNTER — Emergency Department (HOSPITAL_BASED_OUTPATIENT_CLINIC_OR_DEPARTMENT_OTHER)
Admission: EM | Admit: 2022-11-16 | Discharge: 2022-11-17 | Disposition: A | Discharge status: Home / Self Care | Payer: 59 | Attending: Emergency Medicine | Admitting: Emergency Medicine

## 2022-11-16 DIAGNOSIS — Z7982 Long term (current) use of aspirin: Secondary | ICD-10-CM | POA: Insufficient documentation

## 2022-11-16 DIAGNOSIS — Z79899 Other long term (current) drug therapy: Secondary | ICD-10-CM | POA: Insufficient documentation

## 2022-11-16 DIAGNOSIS — Z794 Long term (current) use of insulin: Secondary | ICD-10-CM | POA: Diagnosis not present

## 2022-11-16 DIAGNOSIS — R1084 Generalized abdominal pain: Secondary | ICD-10-CM

## 2022-11-16 DIAGNOSIS — I10 Essential (primary) hypertension: Secondary | ICD-10-CM | POA: Insufficient documentation

## 2022-11-16 DIAGNOSIS — E1165 Type 2 diabetes mellitus with hyperglycemia: Secondary | ICD-10-CM | POA: Insufficient documentation

## 2022-11-16 DIAGNOSIS — Z7984 Long term (current) use of oral hypoglycemic drugs: Secondary | ICD-10-CM | POA: Insufficient documentation

## 2022-11-16 LAB — CBC
HCT: 41 % (ref 39.0–52.0)
Hemoglobin: 14.3 g/dL (ref 13.0–17.0)
MCH: 29.2 pg (ref 26.0–34.0)
MCHC: 34.9 g/dL (ref 30.0–36.0)
MCV: 83.7 fL (ref 80.0–100.0)
Platelets: 172 10*3/uL (ref 150–400)
RBC: 4.9 MIL/uL (ref 4.22–5.81)
RDW: 13.1 % (ref 11.5–15.5)
WBC: 5.7 10*3/uL (ref 4.0–10.5)
nRBC: 0 % (ref 0.0–0.2)

## 2022-11-16 LAB — COMPREHENSIVE METABOLIC PANEL
ALT: 20 U/L (ref 0–44)
AST: 13 U/L — ABNORMAL LOW (ref 15–41)
Albumin: 4.3 g/dL (ref 3.5–5.0)
Alkaline Phosphatase: 48 U/L (ref 38–126)
Anion gap: 10 (ref 5–15)
BUN: 16 mg/dL (ref 6–20)
CO2: 24 mmol/L (ref 22–32)
Calcium: 9.1 mg/dL (ref 8.9–10.3)
Chloride: 99 mmol/L (ref 98–111)
Creatinine, Ser: 1.02 mg/dL (ref 0.61–1.24)
GFR, Estimated: >60 mL/min (ref >60)
Glucose, Bld: 277 mg/dL — ABNORMAL HIGH (ref 70–99)
Potassium: 3.8 mmol/L (ref 3.5–5.1)
Sodium: 133 mmol/L — ABNORMAL LOW (ref 135–145)
Total Bilirubin: 0.5 mg/dL (ref 0.3–1.2)
Total Protein: 7.3 g/dL (ref 6.5–8.1)

## 2022-11-16 LAB — URINALYSIS, ROUTINE W REFLEX MICROSCOPIC
Bacteria, UA: NONE SEEN
Bilirubin Urine: NEGATIVE
Glucose, UA: >1000 mg/dL — AB
Hgb urine dipstick: NEGATIVE
Ketones, ur: NEGATIVE mg/dL
Leukocytes,Ua: NEGATIVE
Nitrite: NEGATIVE
Protein, ur: NEGATIVE mg/dL
Specific Gravity, Urine: 1.021 (ref 1.005–1.030)
pH: 6 (ref 5.0–8.0)

## 2022-11-16 LAB — LIPASE, BLOOD: Lipase: 49 U/L (ref 11–51)

## 2022-11-16 NOTE — ED Triage Notes (Signed)
Pt c/o upper abdominal pain described as squeezing that started last week. No NVD. Endorses constipation. Last BM today. Started taking berberine supplement last week around the same time he began to have s/s, no longer taking. Hx "stomach ulcer" dx 25 year ago.

## 2022-11-17 ENCOUNTER — Emergency Department (HOSPITAL_BASED_OUTPATIENT_CLINIC_OR_DEPARTMENT_OTHER): Payer: 59

## 2022-11-17 MED ORDER — ONDANSETRON HCL 4 MG PO TABS: 4.0000 mg | ORAL_TABLET | Freq: Three times a day (TID) | ORAL | 0 refills | Status: AC | PRN

## 2022-11-17 MED ORDER — IOHEXOL 300 MG/ML  SOLN
100.0000 mL | Freq: Once | INTRAMUSCULAR | Status: AC | PRN
  Administered 2022-11-17: 100 mL via INTRAVENOUS

## 2022-11-17 MED ORDER — DICYCLOMINE HCL 20 MG PO TABS: 20.0000 mg | ORAL_TABLET | Freq: Two times a day (BID) | ORAL | 0 refills | Status: DC

## 2022-11-17 MED ORDER — DICYCLOMINE HCL 10 MG/ML IM SOLN
20.0000 mg | Freq: Once | INTRAMUSCULAR | Status: AC
  Administered 2022-11-17: 20 mg via INTRAMUSCULAR
  Filled 2022-11-17: qty 2

## 2022-11-17 MED ORDER — ONDANSETRON HCL 4 MG PO TABS: 4.0000 mg | ORAL_TABLET | Freq: Three times a day (TID) | ORAL | 0 refills | Status: DC | PRN

## 2022-11-17 MED ORDER — ONDANSETRON HCL 4 MG/2ML IJ SOLN
4.0000 mg | Freq: Once | INTRAMUSCULAR | Status: AC
  Administered 2022-11-17: 4 mg via INTRAVENOUS
  Filled 2022-11-17: qty 2

## 2022-11-17 MED ORDER — SODIUM CHLORIDE 0.9 % IV BOLUS
1000.0000 mL | Freq: Once | INTRAVENOUS | Status: AC
  Administered 2022-11-17: 1000 mL via INTRAVENOUS

## 2022-11-17 NOTE — ED Provider Notes (Signed)
[place] EMERGENCY DEPARTMENT AT [facility] Provider Note   CSN: [id number] Arrival date & time: 11/16/22  2227     History  Chief Complaint  Patient presents with   Abdominal Pain    Jackson Mahoney is a 57 y.o. male.  Patient with history of hypertension and diabetes here with intermittent abdominal pain for the past 1 week.  States he has cramping in his abdomen that comes and goes lasting for few seconds to minutes at a time.  He has not no nausea, vomiting or diarrhea.  Believes he could have been constipated but took MiraLAX and is now moving his bowels normally.  Pepto-Bismol is not helping.  Pain is described as squeezing and cramping and comes and goes but he still eating and drinking well.  No fevers, chills, nausea or vomiting.  No chest pain or shortness of breath.  No pain with urination or blood in the urine.  Still has appendix and gallbladder.  States he had an ulcer about 25 years ago but did not require surgery.  Denies any black or bloody stools currently.  The history is provided by the patient.  Abdominal Pain Associated symptoms: constipation   Associated symptoms: no cough, no diarrhea, no dysuria, no fever, no hematuria, no nausea, no shortness of breath and no vomiting        Home Medications Prior to Admission medications   Medication Sig Start Date End Date Taking? Authorizing Provider  ACCU-CHEK FASTCLIX LANCETS MISC USE UTD Patient not taking: Reported on 08/24/2020 11/22/17   [provider]  albuterol (VENTOLIN HFA) 108 (90 Base) MCG/ACT inhaler Inhale 2 puffs into the lungs every 6 (six) hours as needed for wheezing or shortness of breath. 04/22/19   Janeece Agee, NP  aspirin 81 MG EC tablet Take 81 mg by mouth daily. Swallow whole.    [provider]  atorvastatin (LIPITOR) 40 MG tablet TAKE 1 TABLET(40 MG) BY MOUTH DAILY 03/13/21   Janeece Agee, NP  canagliflozin (INVOKANA) 300 MG TABS tablet Take 1 tablet (300 mg  total) by mouth daily. 08/24/20   Janeece Agee, NP  famciclovir (FAMVIR) 500 MG tablet TK 1 T PO BID FOR 5 DAYS 02/07/18   [provider]  glucose blood test strip Use as instructed  Diagnosis Code E11.9 05/25/19   Janeece Agee, NP  glucose monitoring kit (FREESTYLE) monitoring kit 1 each by Does not apply route as needed for other. 07/25/12 02/24/18  Peyton Najjar, MD  insulin glargine (LANTUS SOLOSTAR) 100 UNIT/ML Solostar Pen INJECT 20 UNITS INTO THE SKIN DAILY AT 10 PM 08/24/20   Janeece Agee, NP  insulin glargine (LANTUS) 100 UNIT/ML injection Inject 0.2 mLs (20 Units total) into the skin at bedtime. 08/24/20 08/24/21  Janeece Agee, NP  Insulin Pen Needle (BD PEN NEEDLE NANO U/F) 32G X 4 MM MISC USE WITH INSULIN EVERY EVENING 08/24/20   Janeece Agee, NP  Insulin Syringe-Needle U-100 (B-D INSULIN SYRINGE) 31G X 5/16" 0.3 ML MISC Use to administer insulin daily at bedtime 08/24/20   Janeece Agee, NP  lisinopril (ZESTRIL) 40 MG tablet TAKE 1 TABLET(40 MG) BY MOUTH DAILY 05/10/21   Janeece Agee, NP  metFORMIN (GLUCOPHAGE) 1000 MG tablet TAKE 1 TABLET(1000 MG) BY MOUTH TWICE DAILY WITH A MEAL 09/25/21   Janeece Agee, NP  zolpidem (AMBIEN) 10 MG tablet TK 1 T PO HS PRN 01/24/18   [provider]      Allergies    Wasp venom and Chloroquine  Review of Systems   Review of Systems  Constitutional:  Negative for activity change, appetite change and fever.  HENT:  Negative for congestion and rhinorrhea.   Respiratory:  Negative for cough, chest tightness and shortness of breath.   Gastrointestinal:  Positive for abdominal pain and constipation. Negative for diarrhea, nausea and vomiting.  Genitourinary:  Negative for dysuria and hematuria.  Skin:  Negative for rash.  Neurological:  Negative for dizziness, weakness and headaches.   all other systems are negative except as noted in the HPI and PMH.    Physical Exam Updated Vital Signs BP (!) 145/94   Pulse  63   Temp 97.8 F (36.6 C) (Oral)   Resp 20   Ht 5\' 11"  (1.803 m)   Wt 82.1 kg   SpO2 99%   BMI 25.24 kg/m  Physical Exam Vitals and nursing note reviewed.  Constitutional:      General: He is not in acute distress.    Appearance: He is well-developed.  HENT:     Head: Normocephalic and atraumatic.     Mouth/Throat:     Pharynx: No oropharyngeal exudate.  Eyes:     Conjunctiva/sclera: Conjunctivae normal.     Pupils: Pupils are equal, round, and reactive to light.  Neck:     Comments: No meningismus. Cardiovascular:     Rate and Rhythm: Normal rate and regular rhythm.     Heart sounds: Normal heart sounds. No murmur heard. Pulmonary:     Effort: Pulmonary effort is normal. No respiratory distress.     Breath sounds: Normal breath sounds.  Abdominal:     Palpations: Abdomen is soft.     Tenderness: There is abdominal tenderness. There is no guarding or rebound.     Comments: Periumbilical abdominal tenderness, no guarding or rebound  Musculoskeletal:        General: No tenderness. Normal range of motion.     Cervical back: Normal range of motion and neck supple.     Comments: No CVA tenderness  Skin:    General: Skin is warm.  Neurological:     Mental Status: He is alert and oriented to person, place, and time.     Cranial Nerves: No cranial nerve deficit.     Motor: No abnormal muscle tone.     Coordination: Coordination normal.     Comments: No ataxia on finger to nose bilaterally. No pronator drift. 5/5 strength throughout. CN 2-12 intact.Equal grip strength. Sensation intact.   Psychiatric:        Behavior: Behavior normal.     ED Results / Procedures / Treatments   Labs (all labs ordered are listed, but only abnormal results are displayed) Labs Reviewed  COMPREHENSIVE METABOLIC PANEL - Abnormal; Notable for the following components:      Result Value   Sodium 133 (*)    Glucose, Bld 277 (*)    AST 13 (*)    All other components within normal limits   URINALYSIS, ROUTINE W REFLEX MICROSCOPIC - Abnormal; Notable for the following components:   Glucose, UA >1,000 (*)    All other components within normal limits  LIPASE, BLOOD  CBC    EKG None  Radiology CT ABDOMEN PELVIS W CONTRAST  Result Date: 11/17/2022 CLINICAL DATA:  Abdominal pain, acute, nonlocalized EXAM: CT ABDOMEN AND PELVIS WITH CONTRAST TECHNIQUE: Multidetector CT imaging of the abdomen and pelvis was performed using the standard protocol following bolus administration of intravenous contrast. RADIATION DOSE REDUCTION: This exam was  performed according to the departmental dose-optimization program which includes automated exposure control, adjustment of the mA and/or kV according to patient size and/or use of iterative reconstruction technique. CONTRAST:  OMNIPAQUE IOHEXOL 300 MG/ML  SOLN COMPARISON:  None Available. FINDINGS: Lower chest: No acute abnormality. Hepatobiliary: No focal liver abnormality is seen. No gallstones, gallbladder wall thickening, or biliary dilatation. Pancreas: Unremarkable Spleen: Unremarkable Adrenals/Urinary Tract: The adrenal glands are unremarkable. The kidneys are normal in size and position. Tiny hypodensity within the interpolar region of the left kidney is too small to accurately characterize but likely represents a tiny cortical cyst. No follow-up imaging is recommended. The kidneys are otherwise unremarkable. The bladder is unremarkable. Stomach/Bowel: Stomach is within normal limits. Appendix appears normal. No evidence of bowel wall thickening, distention, or inflammatory changes. Vascular/Lymphatic: Aortic atherosclerosis. No enlarged abdominal or pelvic lymph nodes. Reproductive: Prostate is unremarkable. Other: No abdominal wall hernia or abnormality. No abdominopelvic ascites. Musculoskeletal: No acute or significant osseous findings. IMPRESSION: 1. No acute intra-abdominal pathology identified. No definite radiographic explanation for the  patient's reported symptoms. 2.  Aortic Atherosclerosis (ICD10-I70.0). Electronically Signed   By: Helyn Numbers M.D.   On: 11/17/2022 00:39    Procedures Procedures    Medications Ordered in ED Medications  ondansetron (ZOFRAN) injection 4 mg (has no administration in time range)  dicyclomine (BENTYL) injection 20 mg (has no administration in time range)  sodium chloride 0.9 % bolus 1,000 mL (1,000 mLs Intravenous New Bag/Given 11/17/22 0014)    ED Course/ Medical Decision Making/ A&P                             Medical Decision Making Amount and/or Complexity of Data Reviewed Labs: ordered. Decision-making details documented in ED Course. Radiology: ordered and independent interpretation performed. Decision-making details documented in ED Course. ECG/medicine tests: ordered and independent interpretation performed. Decision-making details documented in ED Course.  Risk Prescription drug management.   1 week of intermittent abdominal pain that comes and goes.  Vital stable, no distress.  Abdomen soft.  No significant peritoneal signs.  Labs reassuring, no leukocytosis.  LFTs and lipase are normal.  Urinalysis is negative.  Hyperglycemia without evidence of DKA.  Will hydrate and treat symptoms.  CT scan obtained to evaluate for appendicitis  CT scan is normal. Results reviewed and interpreted by me.  No evidence of appendicitis or other acute pathology.  Abdomen soft on recheck.  Patient tolerating p.o.  Considered for possible IBS.  Will refer to gastroenterology for follow-up.  Initiate Bentyl.  Patient tolerating p.o. and appears well with soft abdomen.  Return to precautions discussed.        Final Clinical Impression(s) / ED Diagnoses Final diagnoses:  None    Rx / DC Orders ED Discharge Orders     None         Vondell Babers, Jeannett Senior, MD 11/17/22 [id number]

## 2022-11-17 NOTE — Discharge Instructions (Signed)
Your testing is reassuring.  Take the medication as prescribed for abdominal cramps.  Follow-up with the gastroenterologist.  Return to the ED with new or worsening symptoms.

## 2023-02-19 ENCOUNTER — Ambulatory Visit: Payer: Self-pay | Admitting: Gastroenterology

## 2023-02-20 ENCOUNTER — Ambulatory Visit: Payer: BC Managed Care – PPO | Admitting: Gastroenterology

## 2023-02-20 ENCOUNTER — Encounter: Payer: Self-pay | Admitting: Gastroenterology

## 2023-02-20 ENCOUNTER — Other Ambulatory Visit (INDEPENDENT_AMBULATORY_CARE_PROVIDER_SITE_OTHER): Payer: BC Managed Care – PPO

## 2023-02-20 VITALS — BP 124/70 | HR 79 | Ht 71.0 in | Wt 185.8 lb

## 2023-02-20 DIAGNOSIS — K921 Melena: Secondary | ICD-10-CM | POA: Diagnosis not present

## 2023-02-20 DIAGNOSIS — R1013 Epigastric pain: Secondary | ICD-10-CM | POA: Diagnosis not present

## 2023-02-20 LAB — COMPREHENSIVE METABOLIC PANEL
ALT: 22 U/L (ref 0–53)
AST: 15 U/L (ref 0–37)
Albumin: 4.1 g/dL (ref 3.5–5.2)
Alkaline Phosphatase: 67 U/L (ref 39–117)
BUN: 11 mg/dL (ref 6–23)
CO2: 26 meq/L (ref 19–32)
Calcium: 9.2 mg/dL (ref 8.4–10.5)
Chloride: 99 meq/L (ref 96–112)
Creatinine, Ser: 1.02 mg/dL (ref 0.40–1.50)
GFR: 81.56 mL/min (ref >60.00)
Glucose, Bld: 206 mg/dL — ABNORMAL HIGH (ref 70–99)
Potassium: 3.9 meq/L (ref 3.5–5.1)
Sodium: 133 meq/L — ABNORMAL LOW (ref 135–145)
Total Bilirubin: 1 mg/dL (ref 0.2–1.2)
Total Protein: 7.2 g/dL (ref 6.0–8.3)

## 2023-02-20 LAB — CBC WITH DIFFERENTIAL/PLATELET
Basophils Absolute: 0 10*3/uL (ref 0.0–0.1)
Basophils Relative: 0.9 % (ref 0.0–3.0)
Eosinophils Absolute: 0.1 10*3/uL (ref 0.0–0.7)
Eosinophils Relative: 2.8 % (ref 0.0–5.0)
HCT: 40 % (ref 39.0–52.0)
Hemoglobin: 13 g/dL (ref 13.0–17.0)
Lymphocytes Relative: 40.5 % (ref 12.0–46.0)
Lymphs Abs: 2 10*3/uL (ref 0.7–4.0)
MCHC: 32.5 g/dL (ref 30.0–36.0)
MCV: 87.7 fl (ref 78.0–100.0)
Monocytes Absolute: 0.6 10*3/uL (ref 0.1–1.0)
Monocytes Relative: 12.2 % — ABNORMAL HIGH (ref 3.0–12.0)
Neutro Abs: 2.1 10*3/uL (ref 1.4–7.7)
Neutrophils Relative %: 43.6 % (ref 43.0–77.0)
Platelets: 154 10*3/uL (ref 150.0–400.0)
RBC: 4.56 Mil/uL (ref 4.22–5.81)
RDW: 13.8 % (ref 11.5–15.5)
WBC: 4.9 10*3/uL (ref 4.0–10.5)

## 2023-02-20 NOTE — Progress Notes (Signed)
Chief Complaint: Epigastric pain Primary GI MD: Dr. Barron Alvine  HPI: 57 year old male with medical history as listed below presents for evaluation of epigastric pain.  Patient states in June he was having epigastric pain as well as dark black/dark green stools.  He reports a previous history of duodenal ulcer greater than 20 years ago (denies endoscopic evaluation) and feels that this pain was similar.  Was seen in ED at that time with negative CT scan and unrevealing lab work.  He was put on omeprazole 40 Mg twice daily and states after 3 weeks his symptoms had resolved.  He has not had pain or dark stools in over 2 months.  He is doing well at the moment.  No complaints today.  Denies NSAID use.  CT abdomen pelvis with contrast showed unremarkable liver.  Normal gallstones.  Stomach normal.  No evidence of bowel wall thickening or inflammatory changes.  PREVIOUS GI WORKUP   Colonoscopy for screening 02/2018 - Normal - Internal hemorrhoids - Repeat 10 years  Past Medical History:  Diagnosis Date   Allergy    Diabetes mellitus without complication (HCC)    Hyperlipidemia    Hypertension     Past Surgical History:  Procedure Laterality Date   NO PAST SURGERIES      Current Outpatient Medications  Medication Sig Dispense Refill   atorvastatin (LIPITOR) 40 MG tablet TAKE 1 TABLET(40 MG) BY MOUTH DAILY 90 tablet 0   glucose blood test strip Use as instructed  Diagnosis Code E11.9 100 each 12   famciclovir (FAMVIR) 500 MG tablet TK 1 T PO BID FOR 5 DAYS  5   glucose monitoring kit (FREESTYLE) monitoring kit 1 each by Does not apply route as needed for other. 1 each 0   insulin glargine (LANTUS SOLOSTAR) 100 UNIT/ML Solostar Pen INJECT 20 UNITS INTO THE SKIN DAILY AT 10 PM 30 mL 0   insulin glargine (LANTUS) 100 UNIT/ML injection Inject 0.2 mLs (20 Units total) into the skin at bedtime. 10 mL 3   Insulin Pen Needle (BD PEN NEEDLE NANO U/F) 32G X 4 MM MISC USE WITH INSULIN EVERY  EVENING 100 each 2   Insulin Syringe-Needle U-100 (B-D INSULIN SYRINGE) 31G X 5/16" 0.3 ML MISC Use to administer insulin daily at bedtime 60 each 3   lisinopril (ZESTRIL) 40 MG tablet TAKE 1 TABLET(40 MG) BY MOUTH DAILY 30 tablet 0   metFORMIN (GLUCOPHAGE) 1000 MG tablet TAKE 1 TABLET(1000 MG) BY MOUTH TWICE DAILY WITH A MEAL 60 tablet 0   ondansetron (ZOFRAN) 4 MG tablet Take 1 tablet (4 mg total) by mouth every 8 (eight) hours as needed for nausea or vomiting. 20 tablet 0   zolpidem (AMBIEN) 10 MG tablet TK 1 T PO HS PRN  2   Current Facility-Administered Medications  Medication Dose Route Frequency Provider Last Rate Last Admin   0.9 %  sodium chloride infusion  500 mL Intravenous Once Cirigliano, Vito V, DO        Allergies as of 02/20/2023 - Review Complete 02/20/2023  Allergen Reaction Noted   Wasp venom Anaphylaxis 10/17/2011   Chloroquine Itching 10/17/2011    Family History  Problem Relation Age of Onset   Diabetes Father    Colon cancer Neg Hx    Colon polyps Neg Hx    Rectal cancer Neg Hx    Stomach cancer Neg Hx    Esophageal cancer Neg Hx     Social History   Socioeconomic History  Marital status: Single    Spouse name: Not on file   Number of children: Not on file   Years of education: Not on file   Highest education level: Not on file  Occupational History   Not on file  Tobacco Use   Smoking status: Never   Smokeless tobacco: Never  Substance and Sexual Activity   Alcohol use: Yes    Alcohol/week: 2.0 standard drinks of alcohol    Types: 2 Cans of beer per week   Drug use: No   Sexual activity: Never  Other Topics Concern   Not on file  Social History Narrative   Not on file   Social Determinants of Health   Financial Resource Strain: Not on file  Food Insecurity: Not on file  Transportation Needs: Not on file  Physical Activity: Not on file  Stress: Not on file  Social Connections: Not on file  Intimate Partner Violence: Not on file     Review of Systems:    Constitutional: No weight loss, fever, chills, weakness or fatigue HEENT: Eyes: No change in vision               Ears, Nose, Throat:  No change in hearing or congestion Skin: No rash or itching Cardiovascular: No chest pain, chest pressure or palpitations   Respiratory: No SOB or cough Gastrointestinal: See HPI and otherwise negative Genitourinary: No dysuria or change in urinary frequency Neurological: No headache, dizziness or syncope Musculoskeletal: No new muscle or joint pain Hematologic: No bleeding or bruising Psychiatric: No history of depression or anxiety    Physical Exam:  Vital signs: Ht 5\' 11"  (1.803 m)   Wt 84.3 kg   BMI 25.91 kg/m   Constitutional: NAD, Well developed, Well nourished, alert and cooperative Head:  Normocephalic and atraumatic. Eyes:   PEERL, EOMI. No icterus. Conjunctiva pink. Respiratory: Respirations even and unlabored. Lungs clear to auscultation bilaterally.   No wheezes, crackles, or rhonchi.  Cardiovascular:  Regular rate and rhythm. No peripheral edema, cyanosis or pallor.  Gastrointestinal:  Soft, nondistended, nontender. No rebound or guarding. Normal bowel sounds. No appreciable masses or hepatomegaly. Rectal:  Not performed.  Msk:  Symmetrical without gross deformities. Without edema, no deformity or joint abnormality.  Neurologic:  Alert and  oriented x4;  grossly normal neurologically.  Skin:   Dry and intact without significant lesions or rashes. Psychiatric: Oriented to person, place and time. Demonstrates good judgement and reason without abnormal affect or behaviors.   RELEVANT LABS AND IMAGING: CBC    Component Value Date/Time   WBC 5.7 11/16/2022 2237   RBC 4.90 11/16/2022 2237   HGB 14.3 11/16/2022 2237   HGB 12.0 (L) 06/01/2017 1622   HCT 41.0 11/16/2022 2237   HCT 35.5 (L) 06/01/2017 1622   PLT 172 11/16/2022 2237   PLT 218 06/01/2017 1622   MCV 83.7 11/16/2022 2237   MCV 84.8 06/01/2017  1646   MCV 84 06/01/2017 1622   MCH 29.2 11/16/2022 2237   MCHC 34.9 11/16/2022 2237   RDW 13.1 11/16/2022 2237   RDW 14.9 06/01/2017 1622   LYMPHSABS 1.9 06/01/2017 1622   MONOABS 0.3 12/23/2014 1651   EOSABS 0.2 06/01/2017 1622   BASOSABS 0.0 06/01/2017 1622    CMP     Component Value Date/Time   NA 133 (L) 11/16/2022 2237   NA 136 02/04/2020 1732   K 3.8 11/16/2022 2237   CL 99 11/16/2022 2237   CO2 24 11/16/2022 2237  GLUCOSE 277 (H) 11/16/2022 2237   BUN 16 11/16/2022 2237   BUN 19 02/04/2020 1732   CREATININE 1.02 11/16/2022 2237   CREATININE 0.88 07/13/2020 0000   CALCIUM 9.1 11/16/2022 2237   PROT 7.3 11/16/2022 2237   PROT 7.2 02/04/2020 1732   ALBUMIN 4.3 11/16/2022 2237   ALBUMIN 4.2 02/04/2020 1732   AST 13 (L) 11/16/2022 2237   ALT 20 11/16/2022 2237   ALKPHOS 48 11/16/2022 2237   BILITOT 0.5 11/16/2022 2237   BILITOT <0.2 02/04/2020 1732   GFRNONAA >60 11/16/2022 2237   GFRNONAA 97 07/13/2020 0000   GFRAA 113 07/13/2020 0000     Assessment/Plan:   Abdominal pain, epigastric Melena History of epigastric pain and melena in June that has resolved since taking PPI twice daily with unrevealing CT scan and lab workup.  Patient likely could have had an upper GI bleed secondary to esophagitis/gastritis/PUD.  No previous EGD has been done.  Though he does report previous duodenal ulcer more than 20 years ago without endoscopic visualization.  Discussed with patient next steps including EGD even though his symptoms have resolved.  Patient would like EGD to visualize, I think this is reasonable with presence of possible GI bleed.  Though informed him EGD may not yield significant results with lack of symptoms at this time. - EGD for further evaluation - Can continue omeprazole 40 Mg daily - I thoroughly discussed the procedure with the patient (at bedside) to include nature of the procedure, alternatives, benefits, and risks (including but not limited to bleeding,  infection, perforation, anesthesia/cardiac pulmonary complications).  Patient verbalized understanding and gave verbal consent to proceed with procedure. - Follow-up per procedure   Lara Mulch [facility] 02/20/2023, 1:57 PM  Cc: Janeece Agee, NP

## 2023-02-20 NOTE — Patient Instructions (Addendum)
  You have been scheduled for an endoscopy. Please follow written instructions given to you at your visit today.  If you use inhalers (even only as needed), please bring them with you on the day of your procedure.  If you take any of the following medications, they will need to be adjusted prior to your procedure:   DO NOT TAKE 7 DAYS PRIOR TO TEST- Trulicity (dulaglutide) Ozempic, Wegovy (semaglutide) Mounjaro (tirzepatide) Bydureon Bcise (exanatide extended release)  DO NOT TAKE 1 DAY PRIOR TO YOUR TEST Rybelsus (semaglutide) Adlyxin (lixisenatide) Victoza (liraglutide) Byetta (exanatide) ___________________________________________________________________________  Your provider has requested that you go to the basement level for lab work before leaving today. Press "B" on the elevator. The lab is located at the first door on the left as you exit the elevator.   _______________________________________________________  If your blood pressure at your visit was 140/90 or greater, please contact your primary care physician to follow up on this.  _______________________________________________________  If you are age 24 or older, your body mass index should be between 23-30. Your Body mass index is 25.91 kg/m. If this is out of the aforementioned range listed, please consider follow up with your Primary Care Provider.  If you are age 48 or younger, your body mass index should be between 19-25. Your Body mass index is 25.91 kg/m. If this is out of the aformentioned range listed, please consider follow up with your Primary Care Provider.   ________________________________________________________  The [facility] providers would like to encourage you to use [facility] to communicate with providers for non-urgent requests or questions.  Due to long hold times on the telephone, sending your provider a message by [facility] may be a faster and more efficient way to get a response.  Please allow 48  business hours for a response.  Please remember that this is for non-urgent requests.  _______________________________________________________ It was a pleasure to see you today!  Thank you for trusting me with your gastrointestinal care!

## 2023-02-26 DIAGNOSIS — I10 Essential (primary) hypertension: Secondary | ICD-10-CM | POA: Diagnosis not present

## 2023-02-26 DIAGNOSIS — E119 Type 2 diabetes mellitus without complications: Secondary | ICD-10-CM | POA: Diagnosis not present

## 2023-02-26 DIAGNOSIS — G4709 Other insomnia: Secondary | ICD-10-CM | POA: Diagnosis not present

## 2023-02-26 DIAGNOSIS — E782 Mixed hyperlipidemia: Secondary | ICD-10-CM | POA: Diagnosis not present

## 2023-02-27 NOTE — Progress Notes (Signed)
Agree with the assessment and plan as outlined by Cira Servant, PA-C.  Langston Tuberville, DO, [facility]

## 2023-03-11 ENCOUNTER — Encounter: Payer: Self-pay | Admitting: Gastroenterology

## 2023-03-17 ENCOUNTER — Encounter: Payer: Self-pay | Admitting: Certified Registered Nurse Anesthetist

## 2023-03-22 ENCOUNTER — Encounter: Payer: Self-pay | Admitting: Gastroenterology

## 2023-03-22 ENCOUNTER — Ambulatory Visit: Payer: BC Managed Care – PPO | Admitting: Gastroenterology

## 2023-03-22 VITALS — BP 131/98 | HR 82 | Temp 98.4°F | Resp 26 | Ht 71.0 in | Wt 185.0 lb

## 2023-03-22 DIAGNOSIS — K2950 Unspecified chronic gastritis without bleeding: Secondary | ICD-10-CM | POA: Diagnosis not present

## 2023-03-22 DIAGNOSIS — R1013 Epigastric pain: Secondary | ICD-10-CM

## 2023-03-22 DIAGNOSIS — K3189 Other diseases of stomach and duodenum: Secondary | ICD-10-CM | POA: Diagnosis not present

## 2023-03-22 DIAGNOSIS — B9681 Helicobacter pylori [H. pylori] as the cause of diseases classified elsewhere: Secondary | ICD-10-CM

## 2023-03-22 DIAGNOSIS — K2970 Gastritis, unspecified, without bleeding: Secondary | ICD-10-CM

## 2023-03-22 HISTORY — PX: UPPER GASTROINTESTINAL ENDOSCOPY: SHX188

## 2023-03-22 MED ORDER — SODIUM CHLORIDE 0.9 % IV SOLN: 500.0000 mL | Freq: Once | INTRAVENOUS | Status: DC

## 2023-03-22 NOTE — Progress Notes (Signed)
Called to room to assist during endoscopic procedure.  Patient ID and intended procedure confirmed with present staff. Received instructions for my participation in the procedure from the performing physician.  

## 2023-03-22 NOTE — Progress Notes (Signed)
GASTROENTEROLOGY PROCEDURE H&P NOTE   Primary Care Physician: Janeece Agee, NP    Reason for Procedure:   Epigastric pain, dark stools, history of duodenal ulcer  Plan:    EGD  Patient is appropriate for endoscopic procedure(s) in the ambulatory ([facility]) setting.  The nature of the procedure, as well as the risks, benefits, and alternatives were carefully and thoroughly reviewed with the patient. Ample time for discussion and questions allowed. The patient understood, was satisfied, and agreed to proceed.     HPI: Jackson Mahoney is a 57 y.o. male who presents for EGD for evaluation of epigastric pain, ?melena, and reported hx of duodenal ulcer 20+ years ago with concern for recent recurrence .  Patient was most recently seen in the Gastroenterology Clinic on 02/20/2023 by Boone Master, PA-C.  No interval change in medical history since that appointment. Please refer to that note for full details regarding GI history and clinical presentation.   Past Medical History:  Diagnosis Date   Allergy    Diabetes mellitus without complication (HCC)    Hyperlipidemia    Hypertension     Past Surgical History:  Procedure Laterality Date   NO PAST SURGERIES      Prior to Admission medications   Medication Sig Start Date End Date Taking? Authorizing Provider  olmesartan (BENICAR) 20 MG tablet 1 tab(s) orally once a day for 90 days 02/12/23 06/12/23 Yes [provider]  amLODipine (NORVASC) 5 MG tablet Take 5 mg by mouth daily. 12/28/22   [provider]  atorvastatin (LIPITOR) 40 MG tablet TAKE 1 TABLET(40 MG) BY MOUTH DAILY 03/13/21   Janeece Agee, NP  famciclovir (FAMVIR) 500 MG tablet TK 1 T PO BID FOR 5 DAYS 02/07/18   [provider]  glucose blood test strip Use as instructed  Diagnosis Code E11.9 05/25/19   Janeece Agee, NP  glucose monitoring kit (FREESTYLE) monitoring kit 1 each by Does not apply route as needed for other. 07/25/12 02/24/18  Peyton Najjar, MD  insulin glargine (LANTUS SOLOSTAR) 100 UNIT/ML Solostar Pen INJECT 20 UNITS INTO THE SKIN DAILY AT 10 PM 08/24/20   Janeece Agee, NP  metFORMIN (GLUCOPHAGE) 1000 MG tablet TAKE 1 TABLET(1000 MG) BY MOUTH TWICE DAILY WITH A MEAL 09/25/21   Janeece Agee, NP  ondansetron (ZOFRAN) 4 MG tablet Take 1 tablet (4 mg total) by mouth every 8 (eight) hours as needed for nausea or vomiting. 11/17/22   Rancour, Jeannett Senior, MD  zolpidem (AMBIEN) 10 MG tablet TK 1 T PO HS PRN 01/24/18   [provider]    Current Outpatient Medications  Medication Sig Dispense Refill   olmesartan (BENICAR) 20 MG tablet 1 tab(s) orally once a day for 90 days     amLODipine (NORVASC) 5 MG tablet Take 5 mg by mouth daily.     atorvastatin (LIPITOR) 40 MG tablet TAKE 1 TABLET(40 MG) BY MOUTH DAILY 90 tablet 0   famciclovir (FAMVIR) 500 MG tablet TK 1 T PO BID FOR 5 DAYS  5   glucose blood test strip Use as instructed  Diagnosis Code E11.9 100 each 12   glucose monitoring kit (FREESTYLE) monitoring kit 1 each by Does not apply route as needed for other. 1 each 0   insulin glargine (LANTUS SOLOSTAR) 100 UNIT/ML Solostar Pen INJECT 20 UNITS INTO THE SKIN DAILY AT 10 PM 30 mL 0   metFORMIN (GLUCOPHAGE) 1000 MG tablet TAKE 1 TABLET(1000 MG) BY MOUTH TWICE DAILY WITH A MEAL  60 tablet 0   ondansetron (ZOFRAN) 4 MG tablet Take 1 tablet (4 mg total) by mouth every 8 (eight) hours as needed for nausea or vomiting. 20 tablet 0   zolpidem (AMBIEN) 10 MG tablet TK 1 T PO HS PRN  2   Current Facility-Administered Medications  Medication Dose Route Frequency Provider Last Rate Last Admin   0.9 %  sodium chloride infusion  500 mL Intravenous Once Gerrett Loman V, DO        Allergies as of 03/22/2023 - Review Complete 03/17/2023  Allergen Reaction Noted   Wasp venom Anaphylaxis 10/17/2011   Chloroquine Itching 10/17/2011    Family History  Problem Relation Age of Onset   Diabetes Father    Colon cancer Neg Hx     Colon polyps Neg Hx    Rectal cancer Neg Hx    Stomach cancer Neg Hx    Esophageal cancer Neg Hx     Social History   Socioeconomic History   Marital status: Single    Spouse name: Not on file   Number of children: 2   Years of education: Not on file   Highest education level: Not on file  Occupational History   Not on file  Tobacco Use   Smoking status: Never   Smokeless tobacco: Never  Vaping Use   Vaping status: Never Used  Substance and Sexual Activity   Alcohol use: Yes    Alcohol/week: 2.0 standard drinks of alcohol    Types: 2 Cans of beer per week   Drug use: No   Sexual activity: Never  Other Topics Concern   Not on file  Social History Narrative   Not on file   Social Determinants of Health   Financial Resource Strain: Not on file  Food Insecurity: Not on file  Transportation Needs: Not on file  Physical Activity: Not on file  Stress: Not on file  Social Connections: Not on file  Intimate Partner Violence: Not on file    Physical Exam: Vital signs in last 24 hours: @There  were no vitals taken for this visit. GEN: NAD EYE: Sclerae anicteric ENT: MMM CV: Non-tachycardic Pulm: CTA b/l GI: Soft, NT/ND NEURO:  Alert & Oriented x 3   Doristine Locks, DO [facility]   03/22/2023 2:42 PM

## 2023-03-22 NOTE — Progress Notes (Signed)
Report given to PACU, vss 

## 2023-03-22 NOTE — Patient Instructions (Signed)
Await pathology results.  Resume previous diet. Continue present medications.  YOU HAD AN ENDOSCOPIC PROCEDURE TODAY AT THE [facility]:   Refer to the procedure report that was given to you for any specific questions about what was found during the examination.  If the procedure report does not answer your questions, please call your gastroenterologist to clarify.  If you requested that your care partner not be given the details of your procedure findings, then the procedure report has been included in a sealed envelope for you to review at your convenience later.  YOU SHOULD EXPECT: Some feelings of bloating in the abdomen. Passage of more gas than usual.  Walking can help get rid of the air that was put into your GI tract during the procedure and reduce the bloating. If you had a lower endoscopy (such as a colonoscopy or flexible sigmoidoscopy) you may notice spotting of blood in your stool or on the toilet paper. If you underwent a bowel prep for your procedure, you may not have a normal bowel movement for a few days.  Please Note:  You might notice some irritation and congestion in your nose or some drainage.  This is from the oxygen used during your procedure.  There is no need for concern and it should clear up in a day or so.  SYMPTOMS TO REPORT IMMEDIATELY:  Following upper endoscopy (EGD)  Vomiting of blood or coffee ground material  New chest pain or pain under the shoulder blades  Painful or persistently difficult swallowing  New shortness of breath  Fever of 100F or higher  Black, tarry-looking stools  For urgent or emergent issues, a gastroenterologist can be reached at any hour by calling [phone]. Do not use MyChart messaging for urgent concerns.    DIET:  We do recommend a small meal at first, but then you may proceed to your regular diet.  Drink plenty of fluids but you should avoid alcoholic beverages for 24 hours.  ACTIVITY:  You should plan to take  it easy for the rest of today and you should NOT DRIVE or use heavy machinery until tomorrow (because of the sedation medicines used during the test).    FOLLOW UP: Our staff will call the number listed on your records the next business day following your procedure.  We will call around 7:15- 8:00 am to check on you and address any questions or concerns that you may have regarding the information given to you following your procedure. If we do not reach you, we will leave a message.     If any biopsies were taken you will be contacted by phone or by letter within the next 1-3 weeks.  Please call us at [phone] if you have not heard about the biopsies in 3 weeks.    SIGNATURES/CONFIDENTIALITY: You and/or your care partner have signed paperwork which will be entered into your electronic medical record.  These signatures attest to the fact that that the information above on your After Visit Summary has been reviewed and is understood.  Full responsibility of the confidentiality of this discharge information lies with you and/or your care-partner.

## 2023-03-22 NOTE — Op Note (Signed)
[facility] Patient Name: Jackson Mahoney Procedure Date: 03/22/2023 4:20 PM MRN: [id number] Endoscopist: Doristine Locks , MD, [id number] Age: 57 Referring MD:  Date of Birth: Dec 22, 1965 Gender: Male Account #: [id number] Procedure:                Upper GI endoscopy Indications:              Epigastric abdominal pain Medicines:                Monitored Anesthesia Care Procedure:                Pre-Anesthesia Assessment:                           - Prior to the procedure, a History and Physical                            was performed, and patient medications and                            allergies were reviewed. The patient's tolerance of                            previous anesthesia was also reviewed. The risks                            and benefits of the procedure and the sedation                            options and risks were discussed with the patient.                            All questions were answered, and informed consent                            was obtained. Prior Anticoagulants: The patient has                            taken no anticoagulant or antiplatelet agents. ASA                            Grade Assessment: II - A patient with mild systemic                            disease. After reviewing the risks and benefits,                            the patient was deemed in satisfactory condition to                            undergo the procedure.                           After obtaining informed consent, the endoscope was  passed under direct vision. Throughout the                            procedure, the patient's blood pressure, pulse, and                            oxygen saturations were monitored continuously. The                            Olympus Scope [id number] was introduced through the                            mouth, and advanced to the third part of duodenum.                            The upper GI endoscopy was  accomplished without                            difficulty. The patient tolerated the procedure                            well. Scope In: Scope Out: Findings:                 The examined esophagus was normal.                           The Z-line was regular and was found 44 cm from the                            incisors.                           A single, small 5 mm submucosal nodule with no                            bleeding was found in the gastric antrum. The                            nodule was semi-mobile with the closed forceps. The                            overlying mucosa was normal appearing. Bite-on-bite                            tunneled biopsies were taken with a cold forceps                            for histology. Estimated blood loss was minimal.                           Normal mucosa was found in the entire examined                            stomach. Biopsies  were taken with a cold forceps                            for histology and Helicobacter pylori testing.                            Estimated blood loss was minimal.                           The examined duodenum was normal. Complications:            No immediate complications. Estimated Blood Loss:     Estimated blood loss was minimal. Impression:               - Normal esophagus.                           - Z-line regular, 44 cm from the incisors.                           - A single submucosal papule (nodule) found in the                            stomach. Biopsied.                           - Normal mucosa was found in the entire stomach.                            Biopsied.                           - Normal examined duodenum. Recommendation:           - Patient has a contact number available for                            emergencies. The signs and symptoms of potential                            delayed complications were discussed with the                            patient. Return to normal  activities tomorrow.                            Written discharge instructions were provided to the                            patient.                           - Resume previous diet.                           - Continue present medications.                           -  Await pathology results.                           - Follow-up with Bayley McMichael, PA-C in the GI                            Clinic prn. Doristine Locks, MD 03/22/2023 4:38:04 PM

## 2023-03-22 NOTE — Progress Notes (Signed)
1622 Robinul 0.1 mg IV given due large amount of secretions upon assessment.  MD made aware, vss

## 2023-03-25 ENCOUNTER — Telehealth: Payer: Self-pay

## 2023-03-25 NOTE — Telephone Encounter (Signed)
Follow up call to pt, no answer. 

## 2023-03-27 LAB — SURGICAL PATHOLOGY

## 2023-04-01 ENCOUNTER — Other Ambulatory Visit: Payer: Self-pay

## 2023-04-01 DIAGNOSIS — B9681 Helicobacter pylori [H. pylori] as the cause of diseases classified elsewhere: Secondary | ICD-10-CM

## 2023-04-01 MED ORDER — DOXYCYCLINE HYCLATE 100 MG PO TABS: 100.0000 mg | ORAL_TABLET | Freq: Two times a day (BID) | ORAL | 0 refills | Status: AC

## 2023-04-01 MED ORDER — METRONIDAZOLE 250 MG PO TABS: 250.0000 mg | ORAL_TABLET | Freq: Four times a day (QID) | ORAL | 0 refills | Status: AC

## 2023-04-01 MED ORDER — BISMUTH SUBSALICYLATE 262 MG PO CHEW: 524.0000 mg | CHEWABLE_TABLET | Freq: Four times a day (QID) | ORAL | 0 refills | Status: AC

## 2023-04-01 MED ORDER — OMEPRAZOLE 20 MG PO CPDR: 20.0000 mg | DELAYED_RELEASE_CAPSULE | Freq: Two times a day (BID) | ORAL | 0 refills | Status: DC

## 2023-04-08 ENCOUNTER — Ambulatory Visit: Payer: Self-pay | Admitting: Physician Assistant

## 2023-04-11 ENCOUNTER — Encounter: Payer: Self-pay | Admitting: Gastroenterology

## 2023-04-14 ENCOUNTER — Other Ambulatory Visit: Payer: Self-pay | Admitting: Gastroenterology

## 2023-07-25 ENCOUNTER — Encounter: Payer: Self-pay | Admitting: *Deleted

## 2023-07-26 ENCOUNTER — Ambulatory Visit: Payer: BC Managed Care – PPO | Admitting: Gastroenterology

## 2023-07-26 ENCOUNTER — Encounter: Payer: Self-pay | Admitting: Gastroenterology

## 2023-07-26 VITALS — BP 140/76 | HR 71 | Ht 71.0 in | Wt 184.0 lb

## 2023-07-26 DIAGNOSIS — R1013 Epigastric pain: Secondary | ICD-10-CM

## 2023-07-26 DIAGNOSIS — Z8619 Personal history of other infectious and parasitic diseases: Secondary | ICD-10-CM | POA: Diagnosis not present

## 2023-07-26 DIAGNOSIS — B9681 Helicobacter pylori [H. pylori] as the cause of diseases classified elsewhere: Secondary | ICD-10-CM

## 2023-07-26 DIAGNOSIS — K219 Gastro-esophageal reflux disease without esophagitis: Secondary | ICD-10-CM

## 2023-07-26 DIAGNOSIS — K5900 Constipation, unspecified: Secondary | ICD-10-CM | POA: Diagnosis not present

## 2023-07-26 MED ORDER — OMEPRAZOLE 40 MG PO CPDR: 40.0000 mg | DELAYED_RELEASE_CAPSULE | Freq: Every day | ORAL | 3 refills | Status: DC

## 2023-07-26 NOTE — Progress Notes (Signed)
 Chief Complaint: follow up of h pylori Primary GI MD: Dr. Barron Alvine  HPI: 58 year old male with past medical history as listed below presents for follow-up  Last seen 02/20/2023: At that time he was having epigastric pain and dark black/green stools and was seen in ED with negative CT.  After being put on omeprazole 40 Mg twice daily his symptoms had resolved.  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 pylori.  Patient was put on Prilosec, Pepto-Bismol, metronidazole, and doxycycline for treatment of H. pylori for 14 days.  No eradication study has been completed.  ----------------TODAY------------------  Patient presents today with continued epigastric pain. States it is better than what it was but is still present occasionally. He questions weight loss. He states his stool is normal color which is reassuring. Denies nausea/vomiting.  States he no longer has constipation and is doing well with that. He denies rectal pain as well. He is unsure if he is still taking  omeprazole or not.  Wt Readings from Last 3 Encounters:  07/26/23 184 lb (83.5 kg)  03/22/23 185 lb (83.9 kg)  02/20/23 185 lb 12.8 oz (84.3 kg)     PREVIOUS GI WORKUP   EGD 03/22/2023 - Normal esophagus.  - Z- line regular, 44 cm from the incisors.  - A single submucosal papule ( nodule) found in the stomach. Biopsied.  - Normal mucosa was found in the entire stomach. Biopsied.  - Normal examined duodenum.   FINAL DIAGNOSIS        1. Surgical [P], gastric-antral nodule biopsies :       - PANCREATIC HETEROTOPIA       - NEGATIVE FOR CARCINOID TUMOR        2. Surgical [P], gastric biopsies :       - GASTRIC ANTRAL AND OXYNTIC MUCOSA WITH HELICOBACTER PYLORI-ASSOCIATED       GASTRITIS       - HELICOBACTER PYLORI-LIKE ORGANISMS  WERE IDENTIFIED ON ROUTINE H&E STAIN   Colonoscopy for screening 02/2018 - Normal - Internal hemorrhoids - Repeat 10 years  Past Medical History:  Diagnosis Date   Allergy    Diabetes mellitus without complication (HCC)    Hyperlipidemia    Hypertension     Past Surgical History:  Procedure Laterality Date   COLONOSCOPY     NO PAST SURGERIES     UPPER GASTROINTESTINAL ENDOSCOPY  03/22/2023    Current Outpatient Medications  Medication Sig Dispense Refill   omeprazole (PRILOSEC) 20 MG capsule Take 1 capsule (20 mg total) by mouth 2 (two) times daily before a meal for 14 days. 28 capsule 0   amLODipine (NORVASC) 5 MG tablet Take 5 mg by mouth daily.     atorvastatin (LIPITOR) 40 MG tablet TAKE 1 TABLET(40 MG) BY MOUTH DAILY 90 tablet 0   glimepiride (AMARYL) 1 MG tablet Take 1 mg by mouth daily with breakfast.     glucose blood test strip Use as instructed  Diagnosis Code E11.9 100 each 12   glucose monitoring kit (FREESTYLE) monitoring kit 1 each by Does not apply route as needed for other. 1 each 0   metFORMIN (GLUCOPHAGE) 1000 MG tablet TAKE 1 TABLET(1000 MG) BY MOUTH TWICE DAILY WITH A MEAL 60 tablet 0   olmesartan (BENICAR) 20 MG tablet 1 tab(s) orally once a day  for 90 days     ondansetron (ZOFRAN) 4 MG tablet Take 1 tablet (4 mg total) by mouth every 8 (eight) hours as needed for nausea or vomiting. 20 tablet 0   zolpidem (AMBIEN) 10 MG tablet TK 1 T PO HS PRN  2   No current facility-administered medications for this visit.    Allergies as of 07/26/2023 - Review Complete 03/22/2023  Allergen Reaction Noted   Wasp venom Anaphylaxis 10/17/2011   Chloroquine Itching 10/17/2011    Family History  Problem Relation Age of Onset   Diabetes Father    Colon cancer Neg Hx    Colon polyps Neg Hx    Rectal cancer Neg Hx    Stomach cancer Neg Hx    Esophageal cancer Neg Hx     Social History   Socioeconomic History   Marital status: Single    Spouse name: Not on file    Number of children: 2   Years of education: Not on file   Highest education level: Not on file  Occupational History   Not on file  Tobacco Use   Smoking status: Never   Smokeless tobacco: Never  Vaping Use   Vaping status: Never Used  Substance and Sexual Activity   Alcohol use: Yes    Alcohol/week: 2.0 standard drinks of alcohol    Types: 2 Cans of beer per week   Drug use: No   Sexual activity: Yes  Other Topics Concern   Not on file  Social History Narrative   Not on file   Social Drivers of Health   Financial Resource Strain: Not on file  Food Insecurity: Not on file  Transportation Needs: Not on file  Physical Activity: Not on file  Stress: Not on file  Social Connections: Not on file  Intimate Partner Violence: Not on file    Review of Systems:    Constitutional: No weight loss, fever, chills, weakness or fatigue HEENT: Eyes: No change in vision               Ears, Nose, Throat:  No change in hearing or congestion Skin: No rash or itching Cardiovascular: No chest pain, chest pressure or palpitations   Respiratory: No SOB or cough Gastrointestinal: See HPI and otherwise negative Genitourinary: No dysuria or change in urinary frequency Neurological: No headache, dizziness or syncope Musculoskeletal: No new muscle or joint pain Hematologic: No bleeding or bruising Psychiatric: No history of depression or anxiety    Physical Exam:  Vital signs: There were no vitals taken for this visit.  Constitutional: NAD, Well developed, Well nourished, alert and cooperative Head:  Normocephalic and atraumatic. Eyes:   PEERL, EOMI. No icterus. Conjunctiva pink. Respiratory: Respirations even and unlabored. Lungs clear to auscultation bilaterally.   No wheezes, crackles, or rhonchi.  Cardiovascular:  Regular rate and rhythm. No peripheral edema, cyanosis or pallor.  Gastrointestinal:  Soft, nondistended, nontender. No rebound or guarding. Normal bowel sounds. No  appreciable masses or hepatomegaly. Rectal:  Not performed.  Msk:  Symmetrical without gross deformities. Without edema, no deformity or joint abnormality.  Neurologic:  Alert and  oriented x4;  grossly normal neurologically.  Skin:   Dry and intact without significant lesions or rashes. Psychiatric: Oriented to person, place and time. Demonstrates good judgement and reason without abnormal affect or behaviors.   RELEVANT LABS AND IMAGING: CBC    Component Value Date/Time   WBC 4.9 02/20/2023 1428   RBC 4.56 02/20/2023 1428   HGB 13.0  02/20/2023 1428   HGB 12.0 (L) 06/01/2017 1622   HCT 40.0 02/20/2023 1428   HCT 35.5 (L) 06/01/2017 1622   PLT 154.0 02/20/2023 1428   PLT 218 06/01/2017 1622   MCV 87.7 02/20/2023 1428   MCV 84.8 06/01/2017 1646   MCV 84 06/01/2017 1622   MCH 29.2 11/16/2022 2237   MCHC 32.5 02/20/2023 1428   RDW 13.8 02/20/2023 1428   RDW 14.9 06/01/2017 1622   LYMPHSABS 2.0 02/20/2023 1428   LYMPHSABS 1.9 06/01/2017 1622   MONOABS 0.6 02/20/2023 1428   EOSABS 0.1 02/20/2023 1428   EOSABS 0.2 06/01/2017 1622   BASOSABS 0.0 02/20/2023 1428   BASOSABS 0.0 06/01/2017 1622    CMP     Component Value Date/Time   NA 133 (L) 02/20/2023 1428   NA 136 02/04/2020 1732   K 3.9 02/20/2023 1428   CL 99 02/20/2023 1428   CO2 26 02/20/2023 1428   GLUCOSE 206 (H) 02/20/2023 1428   BUN 11 02/20/2023 1428   BUN 19 02/04/2020 1732   CREATININE 1.02 02/20/2023 1428   CREATININE 0.88 07/13/2020 0000   CALCIUM 9.2 02/20/2023 1428   PROT 7.2 02/20/2023 1428   PROT 7.2 02/04/2020 1732   ALBUMIN 4.1 02/20/2023 1428   ALBUMIN 4.2 02/04/2020 1732   AST 15 02/20/2023 1428   ALT 22 02/20/2023 1428   ALKPHOS 67 02/20/2023 1428   BILITOT 1.0 02/20/2023 1428   BILITOT <0.2 02/04/2020 1732   GFRNONAA >60 11/16/2022 2237   GFRNONAA 97 07/13/2020 0000   GFRAA 113 07/13/2020 0000     Assessment/Plan:   History of h pylori History of epigastric pain/dark stool since  June 2024 with EGD 03/2023 with biopsies positive for H pylori and treated with pepto, omeprazole, doxy, and metronidazole with no eradication study completed. Currently having refractory epigastric pain with eating, though less severe. Suspect from his history of GERD. Ddx also includes gallbladder etiology but less likely. Negative CT. Normal labs. -- H pylori stool antigen to test for eradication -- pantoprazole 40mg  once daily (can be taken during stool study since it is diatherix) -- educated patient on lifestyle modifications and provided patient education handout -- follow up 8 weeks  Constipation Currently well controlled - no intervention  [facility] 07/26/2023, 11:34 AM  Cc: Norm Salt, PA

## 2023-07-26 NOTE — Patient Instructions (Signed)
We have sent the following medications to your pharmacy for you to pick up at your convenience: Omeprazole   Your provider has ordered "Diatherix" stool testing for you. You have received a kit from our office today containing all necessary supplies to complete this test. Please carefully read the stool collection instructions provided in the kit before opening the accompanying materials. In addition, be sure there is a label providing your full name and date of birth on the "puritan opti-swab" tube that is supplied in the kit (if you do not see a label with this information on your test tube, please make [place] aware before test collection!). After completing the test, you should secure the purtian tube into the specimen biohazard bag. The [facility] E-Req sheet (including date and time of specimen collection) should be placed into the outside pocket of the specimen biohazard bag and returned to the [place] lab (basement floor of [facility]) within 3 days of collection. Please make sure to give the specimen to a staff member at the lab. DO NOT leave the specimen on the counter.   If the specimen date and time (can be found in the upper right boxed portion of the sheet) are not filled out on the E-Req sheet, the test will NOT be performed.    I appreciate the opportunity to care for you. Boone Master, PA

## 2023-08-05 ENCOUNTER — Telehealth: Payer: Self-pay

## 2023-08-05 NOTE — Telephone Encounter (Signed)
 Contacted patient and patient verbalized understanding of negative Diatherix H pylori stool test.

## 2023-09-04 NOTE — Progress Notes (Signed)
 Agree with the assessment and plan as outlined by Boone Master, PA-C.  Nimrat Woolworth, DO, [facility]

## 2023-09-12 ENCOUNTER — Encounter: Payer: Self-pay | Admitting: Gastroenterology

## 2023-10-02 ENCOUNTER — Ambulatory Visit: Admitting: Gastroenterology

## 2023-10-02 ENCOUNTER — Encounter: Payer: Self-pay | Admitting: Gastroenterology

## 2023-10-02 VITALS — BP 120/80 | HR 69 | Ht 71.0 in | Wt 180.0 lb

## 2023-10-02 DIAGNOSIS — G8929 Other chronic pain: Secondary | ICD-10-CM

## 2023-10-02 DIAGNOSIS — Z8619 Personal history of other infectious and parasitic diseases: Secondary | ICD-10-CM | POA: Diagnosis not present

## 2023-10-02 DIAGNOSIS — K219 Gastro-esophageal reflux disease without esophagitis: Secondary | ICD-10-CM | POA: Diagnosis not present

## 2023-10-02 DIAGNOSIS — R1013 Epigastric pain: Secondary | ICD-10-CM | POA: Diagnosis not present

## 2023-10-02 MED ORDER — OMEPRAZOLE 40 MG PO CPDR: 40.0000 mg | DELAYED_RELEASE_CAPSULE | Freq: Every day | ORAL | 3 refills | Status: AC

## 2023-10-02 NOTE — Patient Instructions (Addendum)
 Start Omeprazole  40 mg po daily 1 tablet 30-45 minutes before breakfast GERD diet, no late meals 3-4 hours before lying down. No NSAIDs If symptoms don't improve on medication call the office and schedule follow-up  _______________________________________________________  If your blood pressure at your visit was 140/90 or greater, please contact your primary care physician to follow up on this.  _______________________________________________________  If you are age 98 or older, your body mass index should be between 23-30. Your Body mass index is 25.1 kg/m. If this is out of the aforementioned range listed, please consider follow up with your Primary Care Provider.  If you are age 70 or younger, your body mass index should be between 19-25. Your Body mass index is 25.1 kg/m. If this is out of the aformentioned range listed, please consider follow up with your Primary Care Provider.   ________________________________________________________  The [facility] providers would like to encourage you to use MYCHART to communicate with providers for non-urgent requests or questions.  Due to long hold times on the telephone, sending your provider a message by [facility] may be a faster and more efficient way to get a response.  Please allow 48 business hours for a response.  Please remember that this is for non-urgent requests.  _______________________________________________________ Thank you for trusting me with your gastrointestinal care!   Dyanna Glasgow, NP

## 2023-10-02 NOTE — Progress Notes (Signed)
 Chief Complaint:follow-up abdominal pain Primary GI Doctor:Dr. Karene Oto  HPI: 58 year old male with past medical history as listed below presents for follow-up.  Patient last seen by Toy Freund, [place] on 07/26/2023. C/o continued epigastric pain.  Denies nausea vomiting or altered bowel habits.  Patient no longer having issues with constipation. H. pylori stool test for eradication negative.  Patient started on pantoprazole 40 mg p.o. daily.   02/20/2023: At that time he was having epigastric pain and dark black/green stools and was seen in ED with negative CT.  After being put on omeprazole  40 Mg twice daily his symptoms had resolved.  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 pylori.   Patient was put on Prilosec, Pepto-Bismol, metronidazole , and doxycycline  for treatment of H. pylori for 14 days.  No eradication study has been completed.  Interval History    Patient states about a month ago he started to have periumbilical pain. He describes it as a dull burning umbilical pain worse with empty stomach.  When he eats the pain goes away.  No known triggers.  Weight stable. Appetite good. No nausea or vomiting. Never started omeprazole  recommended at last appt. He also admits he eats a lot of spicy foods. He will lie down at night an hour after eating.   He also has occasional constipation and taking OTC Metamucil which helps.  Wt Readings from Last 3 Encounters:  10/02/23 180 lb (81.6 kg)  07/26/23 184 lb (83.5 kg)  03/22/23 185 lb (83.9 kg)   Past Medical History:  Diagnosis Date   Allergy    Diabetes mellitus without complication (HCC)    Hyperlipidemia    Hypertension    Past Surgical History:  Procedure Laterality Date   COLONOSCOPY     NO PAST SURGERIES     UPPER GASTROINTESTINAL ENDOSCOPY   03/22/2023    Current Outpatient Medications  Medication Sig Dispense Refill   atorvastatin  (LIPITOR) 40 MG tablet TAKE 1 TABLET(40 MG) BY MOUTH DAILY 90 tablet 0   FARXIGA  5 MG TABS tablet Take 5 mg by mouth daily.     gabapentin (NEURONTIN) 100 MG capsule Take 100 mg by mouth 3 (three) times daily.     glimepiride  (AMARYL ) 1 MG tablet Take 1 mg by mouth daily with breakfast.     glucose blood test strip Use as instructed  Diagnosis Code E11.9 100 each 12   metFORMIN  (GLUCOPHAGE ) 1000 MG tablet TAKE 1 TABLET(1000 MG) BY MOUTH TWICE DAILY WITH A MEAL 60 tablet 0   zolpidem (AMBIEN) 10 MG tablet TK 1 T PO HS PRN  2   amLODipine (NORVASC) 5 MG tablet Take 5 mg by mouth daily. (Patient not taking: Reported on 10/02/2023)     glucose monitoring kit (FREESTYLE) monitoring kit 1 each by Does not apply route as needed for other. 1 each 0   olmesartan (BENICAR) 20 MG tablet 1 tab(s) orally once a day for 90 days     omeprazole  (PRILOSEC) 40 MG capsule Take 1 capsule (40 mg total) by mouth daily. (Patient not taking: Reported on 10/02/2023) 30 capsule 3   ondansetron  (ZOFRAN ) 4 MG tablet Take 1 tablet (4 mg total) by mouth every 8 (eight) hours as needed for nausea or vomiting. (Patient not taking: Reported on 10/02/2023) 20 tablet 0   No  current facility-administered medications for this visit.    Allergies as of 10/02/2023 - Review Complete 10/02/2023  Allergen Reaction Noted   Wasp venom Anaphylaxis 10/17/2011   Chloroquine Itching 10/17/2011    Family History  Problem Relation Age of Onset   Diabetes Father    Colon cancer Neg Hx    Colon polyps Neg Hx    Rectal cancer Neg Hx    Stomach cancer Neg Hx    Esophageal cancer Neg Hx     Review of Systems:    Constitutional: No weight loss, fever, chills, weakness or fatigue HEENT: Eyes: No change in vision               Ears, Nose, Throat:  No change in hearing or congestion Skin: No rash or itching Cardiovascular: No chest pain, chest  pressure or palpitations   Respiratory: No SOB or cough Gastrointestinal: See HPI and otherwise negative Genitourinary: No dysuria or change in urinary frequency Neurological: No headache, dizziness or syncope Musculoskeletal: No new muscle or joint pain Hematologic: No bleeding or bruising Psychiatric: No history of depression or anxiety    Physical Exam:  Vital signs: BP 120/80   Pulse 69   Ht 5\' 11"  (1.803 m)   Wt 180 lb (81.6 kg)   BMI 25.10 kg/m   Constitutional:   Pleasant male appears to be in NAD, Well developed, Well nourished, alert and cooperative Throat: Oral cavity and pharynx without inflammation, swelling or lesion.  Respiratory: Respirations even and unlabored. Lungs clear to auscultation bilaterally.   No wheezes, crackles, or rhonchi.  Cardiovascular: Normal S1, S2. Regular rate and rhythm. No peripheral edema, cyanosis or pallor.  Gastrointestinal:  Soft, nondistended, periumbilical tenderness with palpation. No rebound or guarding. Normal bowel sounds. No appreciable masses or hepatomegaly. Rectal:  Not performed.  Msk:  Symmetrical without gross deformities. Without edema, no deformity or joint abnormality.  Neurologic:  Alert and  oriented x4;  grossly normal neurologically.  Skin:   Dry and intact without significant lesions or rashes. Psychiatric: Oriented to person, place and time. Demonstrates good judgement and reason without abnormal affect or behaviors.  RELEVANT LABS AND IMAGING: CBC    Latest Ref Rng & Units 02/20/2023    2:28 PM 11/16/2022   10:37 PM 01/21/2021    3:07 PM  CBC  WBC 4.0 - 10.5 K/uL 4.9  5.7  5.8   Hemoglobin 13.0 - 17.0 g/dL 13.2  44.0  10.2   Hematocrit 39.0 - 52.0 % 40.0  41.0  42.6   Platelets 150.0 - 400.0 K/uL 154.0  172  185      CMP     Latest Ref Rng & Units 02/20/2023    2:28 PM 11/16/2022   10:37 PM 01/21/2021    3:07 PM  CMP  Glucose 70 - 99 mg/dL 725  366  440   BUN 6 - 23 mg/dL 11  16  14    Creatinine 0.40 -  1.50 mg/dL 3.47  4.25  9.56   Sodium 135 - 145 mEq/L 133  133  135   Potassium 3.5 - 5.1 mEq/L 3.9  3.8  3.8   Chloride 96 - 112 mEq/L 99  99  98   CO2 19 - 32 mEq/L 26  24  26    Calcium  8.4 - 10.5 mg/dL 9.2  9.1  9.7   Total Protein 6.0 - 8.3 g/dL 7.2  7.3    Total Bilirubin 0.2 - 1.2 mg/dL 1.0  0.5  Alkaline Phos 39 - 117 U/L 67  48    AST 0 - 37 U/L 15  13    ALT 0 - 53 U/L 22  20       Lab Results  Component Value Date   TSH 0.90 07/13/2020  EGD 03/22/2023 - Normal esophagus.  - Z- line regular, 44 cm from the incisors.  - A single submucosal papule ( nodule) found in the stomach. Biopsied.  - Normal mucosa was found in the entire stomach. Biopsied.  - Normal examined duodenum.    FINAL DIAGNOSIS        1. Surgical [P], gastric-antral nodule biopsies :       - PANCREATIC HETEROTOPIA       - NEGATIVE FOR CARCINOID TUMOR        2. Surgical [P], gastric biopsies :       - GASTRIC ANTRAL AND OXYNTIC MUCOSA WITH HELICOBACTER PYLORI-ASSOCIATED       GASTRITIS       - HELICOBACTER PYLORI-LIKE ORGANISMS WERE IDENTIFIED ON ROUTINE H&E STAIN    Colonoscopy for screening 02/2018 - Normal - Internal hemorrhoids - Repeat 10 years   Assessment: Encounter Diagnoses  Name Primary?   Gastroesophageal reflux disease, unspecified whether esophagitis present Yes   History of Helicobacter pylori infection    Abdominal pain, chronic, epigastric   57 year old male patient that presents for intermittent episodes of epigastric pain and dyspepsia.  Patient has been tested and treated in the past for H. pylori.  Eradication test completed and negative.  Patient was instructed to take omeprazole  at last appointment however states he did not start.  Patient currently not taking any NSAIDs.  Will go ahead and start patient on omeprazole  40 mg p.o. daily with strict GERD diet and reevaluate at follow-up.  Maia Handa consider imaging.  Plan: -Start Omeprazole  40 mg po daily -Strict GERD diet, no late  meals -recall colonoscopy 02/2028 - follow prn  Thank you for the courtesy of this consult. Please call me with any questions or concerns.   Micaela Stith, FNP-C [facility] 10/02/2023, 2:49 PM  Cc: Dianah Fort, PA

## 2023-10-11 ENCOUNTER — Ambulatory Visit: Payer: BC Managed Care – PPO | Admitting: Gastroenterology

## 2023-10-20 NOTE — Progress Notes (Signed)
 Agree with the assessment and plan as outlined by [facility], FNP-C.  Jackson Bottino, DO, [facility]
